# Patient Record
Sex: Male | Born: 1940 | Race: White | Hispanic: No | Smoking: Never smoker
Health system: Southern US, Community
[De-identification: ages and names within clinical notes are randomized; demographics above are authoritative.]

## PROBLEM LIST (undated history)

## (undated) DIAGNOSIS — E785 Hyperlipidemia, unspecified: Secondary | ICD-10-CM

## (undated) DIAGNOSIS — I4891 Unspecified atrial fibrillation: Secondary | ICD-10-CM

## (undated) DIAGNOSIS — R531 Weakness: Secondary | ICD-10-CM

## (undated) DIAGNOSIS — T4145XA Adverse effect of unspecified anesthetic, initial encounter: Secondary | ICD-10-CM

## (undated) DIAGNOSIS — R0602 Shortness of breath: Secondary | ICD-10-CM

## (undated) DIAGNOSIS — I482 Chronic atrial fibrillation, unspecified: Secondary | ICD-10-CM

## (undated) DIAGNOSIS — R112 Nausea with vomiting, unspecified: Secondary | ICD-10-CM

## (undated) DIAGNOSIS — Z7901 Long term (current) use of anticoagulants: Secondary | ICD-10-CM

## (undated) DIAGNOSIS — I10 Essential (primary) hypertension: Secondary | ICD-10-CM

## (undated) DIAGNOSIS — Z9889 Other specified postprocedural states: Secondary | ICD-10-CM

## (undated) DIAGNOSIS — C61 Malignant neoplasm of prostate: Secondary | ICD-10-CM

## (undated) DIAGNOSIS — T8859XA Other complications of anesthesia, initial encounter: Secondary | ICD-10-CM

## (undated) HISTORY — DX: Weakness: R53.1

## (undated) HISTORY — PX: EXTERNAL AUDITORY CANAL RECONSTRUCTION: SHX428

## (undated) HISTORY — DX: Chronic atrial fibrillation, unspecified: I48.20

## (undated) HISTORY — DX: Hyperlipidemia, unspecified: E78.5

## (undated) HISTORY — DX: Unspecified atrial fibrillation: I48.91

## (undated) HISTORY — PX: PROSTATECTOMY: SHX69

## (undated) HISTORY — DX: Long term (current) use of anticoagulants: Z79.01

## (undated) HISTORY — PX: TONSILLECTOMY AND ADENOIDECTOMY: SUR1326

## (undated) HISTORY — DX: Shortness of breath: R06.02

## (undated) HISTORY — DX: Malignant neoplasm of prostate: C61

## (undated) HISTORY — PX: HERNIA REPAIR: SHX51

## (undated) HISTORY — DX: Essential (primary) hypertension: I10

## (undated) MED FILL — Fluorouracil IV Soln 5 GM/100ML (50 MG/ML): INTRAVENOUS | Qty: 62 | Status: AC

---

## 2000-03-07 ENCOUNTER — Encounter: Admission: RE | Admit: 2000-03-07 | Discharge: 2000-06-05 | Payer: Self-pay | Admitting: Radiation Oncology

## 2000-06-06 ENCOUNTER — Encounter: Admission: RE | Admit: 2000-06-06 | Discharge: 2000-09-04 | Payer: Self-pay | Admitting: Radiation Oncology

## 2006-11-19 ENCOUNTER — Ambulatory Visit (HOSPITAL_COMMUNITY): Admission: RE | Admit: 2006-11-19 | Discharge: 2006-11-19 | Payer: Self-pay | Admitting: Urology

## 2010-09-18 NOTE — Op Note (Signed)
NAME:  Lee Strong, Lee Strong             ACCOUNT NO.:  [id number]   MEDICAL RECORD NO.:  [id number]          PATIENT TYPE:  AMB   LOCATION:  DAY                          FACILITY:  [facility]   PHYSICIAN:  Ronald L. Earlene Plater, M.D.  DATE OF BIRTH:  21-May-1940   DATE OF PROCEDURE:  11/19/2006  DATE OF DISCHARGE:                               OPERATIVE REPORT   DIAGNOSIS:  Right ureterolithiasis with hydroureteronephrosis.   OPERATIVE PROCEDURE:  1. Cystourethroscopy.  2. Right retrograde pyelogram.  3. Holmium laser lithotripsy.  4. Basket stone extraction.  5. Placement of right double-J stent.   SURGEON:  Gaynelle Arabian, MD   ANESTHESIA:  LMA.   ESTIMATED BLOOD LOSS:  Negligible.   TUBES:  A 26-cm 6-French contoured double pigtail stent.   COMPLICATIONS:  None.   INDICATION FOR PROCEDURE:  Mr. 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  He  presented with right flank pain, nausea and vomiting.  He subsequently  underwent a CT scan which revealed a 4 x 5-mm stone at the right  ureterovesical junction with significant hydroureteronephrosis.  He had  also had some hematuria.  He has been cleared for surgery by Dr. Sherlyn Lick,  Cardiology, and also has stopped his Coumadin after understanding risks,  benefits and alternatives and elected to proceed with the above  procedure.   PROCEDURE IN DETAIL:  The patient was placed in supine position and  after proper LMA anesthesia, he was placed in the dorsal lithotomy  position and prepped and draped with Betadine in sterile fashion.  Cystourethroscopy was performed with a 22.5-French Olympus panendoscope.  There was a tiny filamentous bulbar urethral stricture which was easily  dilated with the scope and the bladder was inspected and noted to be  without lesions.  The stone appeared to be impacted in the distal ureter  and with some manipulation, I was able to get a 0.038-French Glidewire  past it and dilated with an inner dilating sheath over the ureteral  access catheter.  Ureteroscopy was then performed with a short thin  ureteroscope and the stone was visualized; it was highly impacted in the  distal orifice and utilizing a 365 micron laser fiber on a setting of  0.5 and repetition rate of 5, the stone was fragmented into multiple  fragments and each of these were grasped with a nitinol basket and  extracted intact.  Inspection of the lower 2/3 of the ureter revealed  there were no perforations and there were no significant stones noted.  A 6-French open-ended catheter was placed into the right renal pelvis.  It was noted that the ureter was quite tortuous from hydronephrosis and  a Glidewire and steep Trendelenburg were necessary to negotiate into the  right renal pelvis.  There were no filling defects in the pelvis and for  left retrograde pyelogram, the sensor wire was placed into the right  renal pelvis and under fluoroscopic guidance, a 26-cm 6-French Contour  double-pigtail stent without a pullout string was placed and noted to be  in good position within  the right renal pelvis and within the bladder.  The bladder was drained, the panendoscope was removed and the patient  was taken to the recovery room stable.   Retrograde ureteral pyelogram:  Utilizing a 6-French open-ended  catheter, a retrograde ureteral pyelogram was performed.  He was noted  to have tortuous ureter, but there were no filling defects and the  collecting system.      Ronald L. Earlene Plater, M.D.  Electronically Signed     RLD/MEDQ  D:  11/19/2006  T:  11/20/2006  Job:  [id number]

## 2011-02-18 LAB — PROTIME-INR: Prothrombin Time: 14.4

## 2011-02-19 LAB — URINALYSIS, ROUTINE W REFLEX MICROSCOPIC
Glucose, UA: NEGATIVE
Hgb urine dipstick: NEGATIVE
Protein, ur: NEGATIVE
Specific Gravity, Urine: 1.019
pH: 6

## 2011-02-19 LAB — CBC
HCT: 40.6
Hemoglobin: 13.9
MCHC: 34.3
MCV: 90.8
RBC: 4.47
WBC: 6

## 2011-02-19 LAB — BASIC METABOLIC PANEL
CO2: 31
Chloride: 101
GFR calc Af Amer: >60
Potassium: 4.3
Sodium: 138

## 2014-05-09 DIAGNOSIS — Z7901 Long term (current) use of anticoagulants: Secondary | ICD-10-CM | POA: Diagnosis not present

## 2014-07-05 DIAGNOSIS — I482 Chronic atrial fibrillation: Secondary | ICD-10-CM | POA: Diagnosis not present

## 2014-08-02 DIAGNOSIS — I482 Chronic atrial fibrillation: Secondary | ICD-10-CM | POA: Diagnosis not present

## 2014-08-29 DIAGNOSIS — M150 Primary generalized (osteo)arthritis: Secondary | ICD-10-CM | POA: Diagnosis not present

## 2014-08-31 DIAGNOSIS — I482 Chronic atrial fibrillation: Secondary | ICD-10-CM | POA: Diagnosis not present

## 2014-09-14 DIAGNOSIS — I482 Chronic atrial fibrillation: Secondary | ICD-10-CM | POA: Diagnosis not present

## 2014-09-22 DIAGNOSIS — I482 Chronic atrial fibrillation: Secondary | ICD-10-CM | POA: Diagnosis not present

## 2014-09-22 DIAGNOSIS — Z1389 Encounter for screening for other disorder: Secondary | ICD-10-CM | POA: Diagnosis not present

## 2014-09-22 DIAGNOSIS — Z125 Encounter for screening for malignant neoplasm of prostate: Secondary | ICD-10-CM | POA: Diagnosis not present

## 2014-09-22 DIAGNOSIS — I119 Hypertensive heart disease without heart failure: Secondary | ICD-10-CM | POA: Diagnosis not present

## 2014-09-22 DIAGNOSIS — Z79899 Other long term (current) drug therapy: Secondary | ICD-10-CM | POA: Diagnosis not present

## 2014-09-22 DIAGNOSIS — Z9181 History of falling: Secondary | ICD-10-CM | POA: Diagnosis not present

## 2014-11-10 DIAGNOSIS — Z7901 Long term (current) use of anticoagulants: Secondary | ICD-10-CM | POA: Diagnosis not present

## 2014-12-20 DIAGNOSIS — I482 Chronic atrial fibrillation, unspecified: Secondary | ICD-10-CM | POA: Insufficient documentation

## 2014-12-20 DIAGNOSIS — I10 Essential (primary) hypertension: Secondary | ICD-10-CM

## 2014-12-20 DIAGNOSIS — Z7901 Long term (current) use of anticoagulants: Secondary | ICD-10-CM

## 2014-12-20 HISTORY — DX: Essential (primary) hypertension: I10

## 2014-12-20 HISTORY — DX: Chronic atrial fibrillation, unspecified: I48.20

## 2014-12-20 HISTORY — DX: Long term (current) use of anticoagulants: Z79.01

## 2014-12-21 DIAGNOSIS — Z7901 Long term (current) use of anticoagulants: Secondary | ICD-10-CM | POA: Diagnosis not present

## 2014-12-21 DIAGNOSIS — I482 Chronic atrial fibrillation: Secondary | ICD-10-CM | POA: Diagnosis not present

## 2014-12-21 DIAGNOSIS — I10 Essential (primary) hypertension: Secondary | ICD-10-CM | POA: Diagnosis not present

## 2014-12-23 DIAGNOSIS — Z7901 Long term (current) use of anticoagulants: Secondary | ICD-10-CM | POA: Diagnosis not present

## 2014-12-24 DIAGNOSIS — S60212A Contusion of left wrist, initial encounter: Secondary | ICD-10-CM | POA: Diagnosis not present

## 2014-12-30 DIAGNOSIS — Z7901 Long term (current) use of anticoagulants: Secondary | ICD-10-CM | POA: Diagnosis not present

## 2015-01-27 DIAGNOSIS — Z7901 Long term (current) use of anticoagulants: Secondary | ICD-10-CM | POA: Diagnosis not present

## 2015-01-30 DIAGNOSIS — Z7901 Long term (current) use of anticoagulants: Secondary | ICD-10-CM | POA: Diagnosis not present

## 2015-02-07 DIAGNOSIS — Z7901 Long term (current) use of anticoagulants: Secondary | ICD-10-CM | POA: Diagnosis not present

## 2015-03-15 DIAGNOSIS — Z7901 Long term (current) use of anticoagulants: Secondary | ICD-10-CM | POA: Diagnosis not present

## 2015-03-15 DIAGNOSIS — I482 Chronic atrial fibrillation: Secondary | ICD-10-CM | POA: Diagnosis not present

## 2015-03-29 DIAGNOSIS — I482 Chronic atrial fibrillation: Secondary | ICD-10-CM | POA: Diagnosis not present

## 2015-03-29 DIAGNOSIS — Z7901 Long term (current) use of anticoagulants: Secondary | ICD-10-CM | POA: Diagnosis not present

## 2015-05-31 DIAGNOSIS — I4891 Unspecified atrial fibrillation: Secondary | ICD-10-CM | POA: Diagnosis not present

## 2015-08-02 DIAGNOSIS — Z7901 Long term (current) use of anticoagulants: Secondary | ICD-10-CM | POA: Diagnosis not present

## 2015-09-27 DIAGNOSIS — Z7901 Long term (current) use of anticoagulants: Secondary | ICD-10-CM | POA: Diagnosis not present

## 2015-10-10 DIAGNOSIS — Z7901 Long term (current) use of anticoagulants: Secondary | ICD-10-CM | POA: Diagnosis not present

## 2015-10-25 DIAGNOSIS — Z7901 Long term (current) use of anticoagulants: Secondary | ICD-10-CM | POA: Diagnosis not present

## 2015-11-09 DIAGNOSIS — Z7901 Long term (current) use of anticoagulants: Secondary | ICD-10-CM | POA: Diagnosis not present

## 2015-11-23 DIAGNOSIS — Z7901 Long term (current) use of anticoagulants: Secondary | ICD-10-CM | POA: Diagnosis not present

## 2015-12-28 DIAGNOSIS — Z7901 Long term (current) use of anticoagulants: Secondary | ICD-10-CM | POA: Diagnosis not present

## 2016-01-11 DIAGNOSIS — Z7901 Long term (current) use of anticoagulants: Secondary | ICD-10-CM | POA: Diagnosis not present

## 2016-02-02 DIAGNOSIS — Z7901 Long term (current) use of anticoagulants: Secondary | ICD-10-CM | POA: Diagnosis not present

## 2016-02-02 DIAGNOSIS — I10 Essential (primary) hypertension: Secondary | ICD-10-CM | POA: Diagnosis not present

## 2016-02-02 DIAGNOSIS — I482 Chronic atrial fibrillation: Secondary | ICD-10-CM | POA: Diagnosis not present

## 2016-02-13 DIAGNOSIS — Z7901 Long term (current) use of anticoagulants: Secondary | ICD-10-CM | POA: Diagnosis not present

## 2016-02-20 DIAGNOSIS — Z2821 Immunization not carried out because of patient refusal: Secondary | ICD-10-CM | POA: Diagnosis not present

## 2016-02-20 DIAGNOSIS — E785 Hyperlipidemia, unspecified: Secondary | ICD-10-CM | POA: Diagnosis not present

## 2016-02-20 DIAGNOSIS — Z0000 Encounter for general adult medical examination without abnormal findings: Secondary | ICD-10-CM | POA: Diagnosis not present

## 2016-02-20 DIAGNOSIS — Z1389 Encounter for screening for other disorder: Secondary | ICD-10-CM | POA: Diagnosis not present

## 2016-02-20 DIAGNOSIS — Z79899 Other long term (current) drug therapy: Secondary | ICD-10-CM | POA: Diagnosis not present

## 2016-02-20 DIAGNOSIS — Z9181 History of falling: Secondary | ICD-10-CM | POA: Diagnosis not present

## 2016-03-19 DIAGNOSIS — Z7901 Long term (current) use of anticoagulants: Secondary | ICD-10-CM | POA: Diagnosis not present

## 2016-04-23 DIAGNOSIS — M1712 Unilateral primary osteoarthritis, left knee: Secondary | ICD-10-CM | POA: Diagnosis not present

## 2016-04-23 DIAGNOSIS — E785 Hyperlipidemia, unspecified: Secondary | ICD-10-CM | POA: Diagnosis not present

## 2016-04-23 DIAGNOSIS — I482 Chronic atrial fibrillation: Secondary | ICD-10-CM | POA: Diagnosis not present

## 2016-04-23 DIAGNOSIS — Z7901 Long term (current) use of anticoagulants: Secondary | ICD-10-CM | POA: Diagnosis not present

## 2016-04-23 DIAGNOSIS — I119 Hypertensive heart disease without heart failure: Secondary | ICD-10-CM | POA: Diagnosis not present

## 2016-05-30 DIAGNOSIS — Z7901 Long term (current) use of anticoagulants: Secondary | ICD-10-CM | POA: Diagnosis not present

## 2016-07-02 DIAGNOSIS — I119 Hypertensive heart disease without heart failure: Secondary | ICD-10-CM | POA: Diagnosis not present

## 2016-07-02 DIAGNOSIS — R55 Syncope and collapse: Secondary | ICD-10-CM | POA: Diagnosis not present

## 2016-07-02 DIAGNOSIS — I482 Chronic atrial fibrillation: Secondary | ICD-10-CM | POA: Diagnosis not present

## 2016-07-02 DIAGNOSIS — R42 Dizziness and giddiness: Secondary | ICD-10-CM | POA: Diagnosis not present

## 2016-07-02 DIAGNOSIS — S90511A Abrasion, right ankle, initial encounter: Secondary | ICD-10-CM | POA: Diagnosis not present

## 2016-07-24 DIAGNOSIS — Z7901 Long term (current) use of anticoagulants: Secondary | ICD-10-CM | POA: Diagnosis not present

## 2016-09-03 DIAGNOSIS — Z7901 Long term (current) use of anticoagulants: Secondary | ICD-10-CM | POA: Diagnosis not present

## 2016-10-30 DIAGNOSIS — Z7901 Long term (current) use of anticoagulants: Secondary | ICD-10-CM | POA: Diagnosis not present

## 2016-11-20 ENCOUNTER — Other Ambulatory Visit: Payer: Self-pay | Admitting: Cardiology

## 2016-12-17 ENCOUNTER — Ambulatory Visit (INDEPENDENT_AMBULATORY_CARE_PROVIDER_SITE_OTHER): Payer: Medicare Other | Admitting: Cardiology

## 2016-12-17 ENCOUNTER — Encounter: Payer: Self-pay | Admitting: Cardiology

## 2016-12-17 VITALS — BP 130/80 | HR 64 | Resp 10 | Ht 74.0 in | Wt 182.8 lb

## 2016-12-17 DIAGNOSIS — I482 Chronic atrial fibrillation, unspecified: Secondary | ICD-10-CM

## 2016-12-17 DIAGNOSIS — Z7901 Long term (current) use of anticoagulants: Secondary | ICD-10-CM

## 2016-12-17 DIAGNOSIS — I10 Essential (primary) hypertension: Secondary | ICD-10-CM

## 2016-12-17 DIAGNOSIS — R55 Syncope and collapse: Secondary | ICD-10-CM

## 2016-12-17 NOTE — Progress Notes (Signed)
Cardiology Office Note:    Date:  12/17/2016   ID:  Lee Strong, DOB February 23, 1941, MRN [id number]  PCP:  Cyndy Freeze, MD  Cardiologist:  Shirlee More, MD    Referring MD: Cyndy Freeze, MD    ASSESSMENT:    1. Chronic atrial fibrillation ([place])   2. Essential hypertension   3. Long term (current) use of anticoagulants   4. Syncope, unspecified syncope type    PLAN:    In order of problems listed above:  1. Rate is controlled continue beta blocker warfarin anticoagulation and check Holter monitor to assess for symptomatic bradycardia. 2. Stable blood pressure target at this time we'll stop calcium channel blocker and monitor home blood pressure. 3. Stable continue warfarin goal INR 2-3.5 4. Further evaluation with monitor    Next appointment: 6 months all   Medication Adjustments/Labs and Tests Ordered: Current medicines are reviewed at length with the patient today.  Concerns regarding medicines are outlined above.  Orders Placed This Encounter  Procedures  . Holter monitor - 48 hour  . EKG 12-Lead   No orders of the defined types were placed in this encounter.   Chief Complaint  Patient presents with  . Follow-up  . Atrial Fibrillation    History of Present Illness:    Lee Strong is a 76 y.o. male with a hx of chronic AF on warfarin, RBBB and hypertension last seen in September 2017. Overall he is done well little or no awareness of atrial fibrillation exercise intolerance chest pain or shortness of breath or TIA. He's had no bleeding complications of his anticoagulant. He notices dependent edema at the end of the day takes a calcium channel blocker and several months ago had an abrupt syncopal episode without medical evaluation. Compliance with diet, lifestyle and medications: Yes Past Medical History:  Diagnosis Date  . Chronic atrial fibrillation ([place]) 12/20/2014   Overview:  .CHADS2 vasc score=2  . Essential hypertension 12/20/2014  . Long  term (current) use of anticoagulants 12/20/2014    Past Surgical History:  Procedure Laterality Date  . HERNIA REPAIR    . PROSTATECTOMY      Current Medications: Current Meds  Medication Sig  . metoprolol tartrate (LOPRESSOR) 50 MG tablet Take 1 tablet by mouth 2 (two) times daily.  Marland Kitchen olmesartan (BENICAR) 20 MG tablet Take 1 tablet by mouth daily.  Marland Kitchen warfarin (COUMADIN) 5 MG tablet TAKE 1 TABLET EVERY DAY OR AS DIRECTED  . [DISCONTINUED] amLODipine (NORVASC) 5 MG tablet Take 1 tablet by mouth daily.     Allergies:   Penicillins   Social History   Social History  . Marital status: Married    Spouse name: N/A  . Number of children: N/A  . Years of education: N/A   Social History Main Topics  . Smoking status: Never Smoker  . Smokeless tobacco: Never Used  . Alcohol use No  . Drug use: No  . Sexual activity: Not Asked   Other Topics Concern  . None   Social History Narrative  . None     Family History: The patient's family history includes Heart attack in his father; Hypertension in his father and mother; Stroke in his mother. ROS:   Please see the history of present illness.    All other systems reviewed and are negative.  EKGs/Labs/Other Studies Reviewed:    The following studies were reviewed today:  EKG:  EKG ordered today.  The ekg ordered today demonstrates Rate controlled atrial fibrillation right bundle  branch block  Recent Labs: No results found for requested labs within last 8760 hours.  Recent Lipid Panel No results found for: CHOL, TRIG, HDL, CHOLHDL, VLDL, LDLCALC, LDLDIRECT  Physical Exam:    VS:  BP 130/80   Pulse 64   Resp 10   Ht 6\' 2"  (1.88 m)   Wt 182 lb 12.8 oz (82.9 kg)   BMI 23.47 kg/m     Wt Readings from Last 3 Encounters:  12/17/16 182 lb 12.8 oz (82.9 kg)     GEN:  Well nourished, well developed in no acute distress HEENT: Normal NECK: No JVD; No carotid bruits LYMPHATICS: No lymphadenopathy CARDIAC: RRR, no murmurs,  rubs, gallops RESPIRATORY:  Clear to auscultation without rales, wheezing or rhonchi  ABDOMEN: Soft, non-tender, non-distended MUSCULOSKELETAL:  No edema; No deformity  SKIN: Warm and dry NEUROLOGIC:  Alert and oriented x 3 PSYCHIATRIC:  Normal affect    Signed, Shirlee More, MD  12/17/2016 12:05 PM    [place]

## 2016-12-17 NOTE — Patient Instructions (Addendum)
Medication Instructions:  Your physician has recommended you make the following change in your medication:  STOP amlodipine Check your BP daily, contact me if systolic remains > 696   Labwork: None  Testing/Procedures: You had an EKG today.   Your physician has recommended that you wear a holter monitor. Holter monitors are medical devices that record the heart's electrical activity. Doctors most often use these monitors to diagnose arrhythmias. Arrhythmias are problems with the speed or rhythm of the heartbeat. The monitor is a small, portable device. You can wear one while you do your normal daily activities. This is usually used to diagnose what is causing palpitations/syncope (passing out).   Follow-Up: Your physician wants you to follow-up in: 6 months. You will receive a reminder letter in the mail two months in advance. If you don't receive a letter, please call our office to schedule the follow-up appointment.   Any Other Special Instructions Will Be Listed Below (If Applicable).     If you need a refill on your cardiac medications before your next appointment, please call your pharmacy.     Check your BP daily, contact me if systolic remains > 295

## 2016-12-23 ENCOUNTER — Other Ambulatory Visit: Payer: Self-pay | Admitting: Cardiology

## 2016-12-23 NOTE — Telephone Encounter (Signed)
Left message to return call regarding where patient is having is coumadin checked.

## 2016-12-24 NOTE — Telephone Encounter (Signed)
Left msg for pt to return call to notify our office where he plans to continue cardiac care, and where he is having his coumadin checked.

## 2016-12-31 DIAGNOSIS — Z7901 Long term (current) use of anticoagulants: Secondary | ICD-10-CM | POA: Diagnosis not present

## 2017-01-01 ENCOUNTER — Ambulatory Visit (INDEPENDENT_AMBULATORY_CARE_PROVIDER_SITE_OTHER): Payer: Medicare Other

## 2017-01-01 DIAGNOSIS — I482 Chronic atrial fibrillation, unspecified: Secondary | ICD-10-CM

## 2017-02-20 DIAGNOSIS — E785 Hyperlipidemia, unspecified: Secondary | ICD-10-CM | POA: Diagnosis not present

## 2017-02-20 DIAGNOSIS — Z125 Encounter for screening for malignant neoplasm of prostate: Secondary | ICD-10-CM | POA: Diagnosis not present

## 2017-02-20 DIAGNOSIS — I119 Hypertensive heart disease without heart failure: Secondary | ICD-10-CM | POA: Diagnosis not present

## 2017-02-20 DIAGNOSIS — Z7901 Long term (current) use of anticoagulants: Secondary | ICD-10-CM | POA: Diagnosis not present

## 2017-02-20 DIAGNOSIS — Z79899 Other long term (current) drug therapy: Secondary | ICD-10-CM | POA: Diagnosis not present

## 2017-02-20 DIAGNOSIS — I482 Chronic atrial fibrillation: Secondary | ICD-10-CM | POA: Diagnosis not present

## 2017-03-29 ENCOUNTER — Other Ambulatory Visit: Payer: Self-pay | Admitting: Cardiology

## 2017-04-22 DIAGNOSIS — I872 Venous insufficiency (chronic) (peripheral): Secondary | ICD-10-CM | POA: Diagnosis not present

## 2017-04-22 DIAGNOSIS — Z79899 Other long term (current) drug therapy: Secondary | ICD-10-CM | POA: Diagnosis not present

## 2017-04-22 DIAGNOSIS — R600 Localized edema: Secondary | ICD-10-CM | POA: Diagnosis not present

## 2017-04-22 DIAGNOSIS — I482 Chronic atrial fibrillation: Secondary | ICD-10-CM | POA: Diagnosis not present

## 2017-04-22 DIAGNOSIS — Z7901 Long term (current) use of anticoagulants: Secondary | ICD-10-CM | POA: Diagnosis not present

## 2017-04-22 DIAGNOSIS — I119 Hypertensive heart disease without heart failure: Secondary | ICD-10-CM | POA: Diagnosis not present

## 2017-04-25 DIAGNOSIS — R600 Localized edema: Secondary | ICD-10-CM | POA: Diagnosis not present

## 2017-04-25 DIAGNOSIS — M25571 Pain in right ankle and joints of right foot: Secondary | ICD-10-CM | POA: Diagnosis not present

## 2017-04-25 DIAGNOSIS — M7989 Other specified soft tissue disorders: Secondary | ICD-10-CM | POA: Diagnosis not present

## 2017-04-25 DIAGNOSIS — I872 Venous insufficiency (chronic) (peripheral): Secondary | ICD-10-CM | POA: Diagnosis not present

## 2017-04-28 DIAGNOSIS — R600 Localized edema: Secondary | ICD-10-CM | POA: Diagnosis not present

## 2017-04-28 DIAGNOSIS — I872 Venous insufficiency (chronic) (peripheral): Secondary | ICD-10-CM | POA: Diagnosis not present

## 2017-04-28 DIAGNOSIS — R2242 Localized swelling, mass and lump, left lower limb: Secondary | ICD-10-CM | POA: Diagnosis not present

## 2017-06-03 DIAGNOSIS — I482 Chronic atrial fibrillation: Secondary | ICD-10-CM | POA: Diagnosis not present

## 2017-06-03 DIAGNOSIS — M1712 Unilateral primary osteoarthritis, left knee: Secondary | ICD-10-CM | POA: Diagnosis not present

## 2017-06-03 DIAGNOSIS — Z7901 Long term (current) use of anticoagulants: Secondary | ICD-10-CM | POA: Diagnosis not present

## 2017-06-03 DIAGNOSIS — Z6824 Body mass index (BMI) 24.0-24.9, adult: Secondary | ICD-10-CM | POA: Diagnosis not present

## 2017-06-09 DIAGNOSIS — Z880 Allergy status to penicillin: Secondary | ICD-10-CM | POA: Diagnosis not present

## 2017-06-09 DIAGNOSIS — I480 Paroxysmal atrial fibrillation: Secondary | ICD-10-CM | POA: Diagnosis not present

## 2017-06-09 DIAGNOSIS — Z7902 Long term (current) use of antithrombotics/antiplatelets: Secondary | ICD-10-CM | POA: Diagnosis not present

## 2017-06-09 DIAGNOSIS — I5043 Acute on chronic combined systolic (congestive) and diastolic (congestive) heart failure: Secondary | ICD-10-CM | POA: Diagnosis not present

## 2017-06-09 DIAGNOSIS — I509 Heart failure, unspecified: Secondary | ICD-10-CM | POA: Diagnosis not present

## 2017-06-09 DIAGNOSIS — R0602 Shortness of breath: Secondary | ICD-10-CM | POA: Diagnosis not present

## 2017-06-09 DIAGNOSIS — I482 Chronic atrial fibrillation: Secondary | ICD-10-CM | POA: Diagnosis not present

## 2017-06-09 DIAGNOSIS — I110 Hypertensive heart disease with heart failure: Secondary | ICD-10-CM | POA: Diagnosis not present

## 2017-06-09 DIAGNOSIS — I4891 Unspecified atrial fibrillation: Secondary | ICD-10-CM | POA: Diagnosis not present

## 2017-06-09 DIAGNOSIS — E7800 Pure hypercholesterolemia, unspecified: Secondary | ICD-10-CM | POA: Diagnosis not present

## 2017-06-09 DIAGNOSIS — I10 Essential (primary) hypertension: Secondary | ICD-10-CM | POA: Diagnosis not present

## 2017-06-09 DIAGNOSIS — E785 Hyperlipidemia, unspecified: Secondary | ICD-10-CM | POA: Diagnosis not present

## 2017-06-10 DIAGNOSIS — E785 Hyperlipidemia, unspecified: Secondary | ICD-10-CM | POA: Diagnosis not present

## 2017-06-10 DIAGNOSIS — I10 Essential (primary) hypertension: Secondary | ICD-10-CM | POA: Diagnosis not present

## 2017-06-10 DIAGNOSIS — I509 Heart failure, unspecified: Secondary | ICD-10-CM | POA: Diagnosis not present

## 2017-06-10 DIAGNOSIS — I5043 Acute on chronic combined systolic (congestive) and diastolic (congestive) heart failure: Secondary | ICD-10-CM | POA: Diagnosis not present

## 2017-06-10 DIAGNOSIS — I4891 Unspecified atrial fibrillation: Secondary | ICD-10-CM | POA: Diagnosis not present

## 2017-06-11 DIAGNOSIS — I509 Heart failure, unspecified: Secondary | ICD-10-CM | POA: Diagnosis not present

## 2017-06-11 DIAGNOSIS — I10 Essential (primary) hypertension: Secondary | ICD-10-CM | POA: Diagnosis not present

## 2017-06-11 DIAGNOSIS — I5043 Acute on chronic combined systolic (congestive) and diastolic (congestive) heart failure: Secondary | ICD-10-CM | POA: Diagnosis not present

## 2017-06-11 DIAGNOSIS — I4891 Unspecified atrial fibrillation: Secondary | ICD-10-CM | POA: Diagnosis not present

## 2017-06-11 DIAGNOSIS — E785 Hyperlipidemia, unspecified: Secondary | ICD-10-CM | POA: Diagnosis not present

## 2017-06-14 DIAGNOSIS — I4891 Unspecified atrial fibrillation: Secondary | ICD-10-CM | POA: Diagnosis not present

## 2017-06-14 DIAGNOSIS — I5043 Acute on chronic combined systolic (congestive) and diastolic (congestive) heart failure: Secondary | ICD-10-CM | POA: Diagnosis not present

## 2017-06-14 DIAGNOSIS — I10 Essential (primary) hypertension: Secondary | ICD-10-CM | POA: Diagnosis not present

## 2017-06-16 DIAGNOSIS — Z7901 Long term (current) use of anticoagulants: Secondary | ICD-10-CM | POA: Diagnosis not present

## 2017-06-17 DIAGNOSIS — I5032 Chronic diastolic (congestive) heart failure: Secondary | ICD-10-CM | POA: Insufficient documentation

## 2017-06-17 DIAGNOSIS — E785 Hyperlipidemia, unspecified: Secondary | ICD-10-CM

## 2017-06-17 DIAGNOSIS — R531 Weakness: Secondary | ICD-10-CM

## 2017-06-17 DIAGNOSIS — I10 Essential (primary) hypertension: Secondary | ICD-10-CM

## 2017-06-17 DIAGNOSIS — I2510 Atherosclerotic heart disease of native coronary artery without angina pectoris: Secondary | ICD-10-CM | POA: Insufficient documentation

## 2017-06-17 DIAGNOSIS — T8859XA Other complications of anesthesia, initial encounter: Secondary | ICD-10-CM | POA: Insufficient documentation

## 2017-06-17 DIAGNOSIS — I4891 Unspecified atrial fibrillation: Secondary | ICD-10-CM

## 2017-06-17 DIAGNOSIS — R0602 Shortness of breath: Secondary | ICD-10-CM

## 2017-06-17 DIAGNOSIS — I519 Heart disease, unspecified: Secondary | ICD-10-CM | POA: Insufficient documentation

## 2017-06-17 DIAGNOSIS — I110 Hypertensive heart disease with heart failure: Secondary | ICD-10-CM | POA: Insufficient documentation

## 2017-06-17 DIAGNOSIS — Z7901 Long term (current) use of anticoagulants: Secondary | ICD-10-CM | POA: Insufficient documentation

## 2017-06-17 DIAGNOSIS — I482 Chronic atrial fibrillation, unspecified: Secondary | ICD-10-CM | POA: Insufficient documentation

## 2017-06-17 DIAGNOSIS — I2584 Coronary atherosclerosis due to calcified coronary lesion: Secondary | ICD-10-CM

## 2017-06-17 HISTORY — DX: Hyperlipidemia, unspecified: E78.5

## 2017-06-17 HISTORY — DX: Unspecified atrial fibrillation: I48.91

## 2017-06-17 HISTORY — DX: Essential (primary) hypertension: I10

## 2017-06-17 HISTORY — DX: Shortness of breath: R06.02

## 2017-06-17 HISTORY — DX: Weakness: R53.1

## 2017-06-17 NOTE — Progress Notes (Signed)
Cardiology Office Note:    Date:  06/18/2017   ID:  Lee Strong, DOB October 31, 1940, MRN [id number]  PCP:  Cyndy Freeze, MD  Cardiologist:  Shirlee More, MD    Referring MD: Cyndy Freeze, MD    ASSESSMENT:    1. Chronic diastolic heart failure ([place])   2. Hypertensive heart disease with heart failure ([place])   3. Right ventricular dysfunction   4. Chronic atrial fibrillation ([facility])   5. Chronic anticoagulation   6. Coronary artery calcification   7. On amiodarone therapy   8. High risk medication use    PLAN:    In order of problems listed above:  1. Heart failure is compensated continue his current loop diuretic check BMP BNP 2. Blood pressure uncontrolled continue treatment spironolactone and ARB check renal function and add hydralazine.  I did ask him to home monitor blood pressure 3. No evidence of right heart failure continue current diuretic 4. Rate controlled I do not think he needs to be on digoxin his GI symptoms may be subtle toxicity and I will discontinue 5. Continue warfarin goal INR of 2.5 6. At risk for obstructive CAD I do not have a mechanism to explain his deterioration is here chronic controlled atrial fibrillation on beta-blocker never had evidence of heart failure in the past.  I asked him to have a cardiac CTA done to screen for severe obstructive CAD that would require a change in treatment and revascularization. 7. Continue low-dose amiodarone rate control disc 8. Discontinue digoxin   Next appointment: 3 weeks   Medication Adjustments/Labs and Tests Ordered: Current medicines are reviewed at length with the patient today.  Concerns regarding medicines are outlined above.  Orders Placed This Encounter  Procedures  . CT CORONARY MORPH W/CTA COR W/SCORE W/CA W/CM &/OR WO/CM  . CT CORONARY FRACTIONAL FLOW RESERVE DATA PREP  . CT CORONARY FRACTIONAL FLOW RESERVE FLUID ANALYSIS  . Basic Metabolic Panel (BMET)  . Pro b natriuretic peptide (BNP)  .  EKG 12-Lead   No orders of the defined types were placed in this encounter.   Chief Complaint  Patient presents with  . Hospitalization Follow-up  . Congestive Heart Failure  . Atrial Fibrillation  . Hypertension    History of Present Illness:    Lee Strong is a 77 y.o. male with a hx of chronic atrial fibrillation and hypertension last seen approximately 1 year ago.  I am unable to access records in care everywhere.Marland Kitchen He recently presented to [facility] with decompensated heart failure and rapid atrial fibrillation requiring amiodarone, digoxin and a beta blocker for rate control and 6 KG diuresis. Compliance with diet, lifestyle and medications: Yes He relates the onset of edema lower extremities approximately 6 months ago since that time he had a slowly progressive pattern of exercise intolerance and fatigue edema exertional shortness of breath and subsequently presented to [facility] in acute decompensated heart failure.  Since discharge he feels weak and fatigued he has some GI upset he is on digoxin and amiodarone and he has no hoarseness.  He has had no angina but he does have coronary artery calcification.  He has long-standing atrial fibrillation at least 10 years in duration previously did not have trouble with rapid heart rates and never had congestive heart failure.  At the time of his deterioration his anticoagulation was withdrawn for joint steroid injection.  His weights are stable at home he does not monitor heart rate or blood pressure he does not  have home heart failure follow-up Past Medical History:  Diagnosis Date  . Atrial fibrillation ([place]) 06/17/2017  . Hyperlipidemia 06/17/2017  . Hypertension 06/17/2017  . Prostate cancer ([place])   . Short of breath on exertion 06/17/2017  . Weakness 06/17/2017    Past Surgical History:  Procedure Laterality Date  . PROSTATECTOMY    . TONSILLECTOMY AND ADENOIDECTOMY      Current Medications: Current Meds  Medication Sig    . amiodarone (PACERONE) 200 MG tablet Take 200 mg by mouth daily.  . digoxin (LANOXIN) 0.125 MG tablet Take 0.125 mg by mouth daily.  . furosemide (LASIX) 40 MG tablet Take 40 mg by mouth daily.  . Metoprolol Succinate 100 MG CS24 Take 2 tablets by mouth daily.  Marland Kitchen olmesartan (BENICAR) 20 MG tablet Take 20 mg by mouth daily.  Marland Kitchen spironolactone (ALDACTONE) 25 MG tablet Take 25 mg by mouth daily.  Marland Kitchen warfarin (COUMADIN) 5 MG tablet Take 2.5 mg by mouth daily.     Allergies:   Penicillins   Social History   Socioeconomic History  . Marital status: Single    Spouse name: None  . Number of children: None  . Years of education: None  . Highest education level: None  Social Needs  . Financial resource strain: None  . Food insecurity - worry: None  . Food insecurity - inability: None  . Transportation needs - medical: None  . Transportation needs - non-medical: None  Occupational History  . None  Tobacco Use  . Smoking status: Never Smoker  . Smokeless tobacco: Former Network engineer and Sexual Activity  . Alcohol use: No    Frequency: Never  . Drug use: No  . Sexual activity: None  Other Topics Concern  . None  Social History Narrative  . None     Family History: The patient's family history includes Colon cancer in his brother; Heart attack in his father; Prostate cancer in his brother; Stroke in his mother. ROS:   Please see the history of present illness.    All other systems reviewed and are negative.  EKGs/Labs/Other Studies Reviewed:    The following studies were reviewed today:  EKG:  EKG ordered today.  The ekg ordered today demonstrates atrial fibrillation controlled rate bifascicular heart block CTA: bilateral pleural effusion, R > L, CAC Echo TTE: EF 40-45%, mild to moderate MR, moderate TR, RV is dilated, paradoxical septal motion but RV systolic only 30 mm Hg Recent Labs: BNP 3780 No results found for requested labs within last 8760 hours.  Recent Lipid  Panel No results found for: CHOL, TRIG, HDL, CHOLHDL, VLDL, LDLCALC, LDLDIRECT  Physical Exam:    VS:  BP (!) 162/100 (BP Location: Right Arm, Patient Position: Sitting, Cuff Size: Normal)   Pulse 69   Ht 6' 2.5" (1.892 m)   Wt 163 lb (73.9 kg)   BMI 20.65 kg/m     Wt Readings from Last 3 Encounters:  06/18/17 163 lb (73.9 kg)     GEN: Appears somewhat frail and chronically ill  in no acute distress HEENT: Normal NECK: No JVD; No carotid bruits LYMPHATICS: No lymphadenopathy CARDIAC: Irregular irregular variable first heart sound no gallop no murmur  RESPIRATORY:  Clear to auscultation without rales, wheezing or rhonchi  ABDOMEN: Soft, non-tender, non-distended MUSCULOSKELETAL:  No edema; No deformity  SKIN: Warm and dry NEUROLOGIC:  Alert and oriented x 3 PSYCHIATRIC:  Normal affect    Signed, Shirlee More, MD  06/18/2017 5:10 PM  [facility]

## 2017-06-18 ENCOUNTER — Ambulatory Visit: Payer: Medicare Other | Admitting: Cardiology

## 2017-06-18 ENCOUNTER — Encounter: Payer: Self-pay | Admitting: Cardiology

## 2017-06-18 VITALS — BP 162/100 | HR 69 | Ht 74.5 in | Wt 163.0 lb

## 2017-06-18 DIAGNOSIS — I110 Hypertensive heart disease with heart failure: Secondary | ICD-10-CM

## 2017-06-18 DIAGNOSIS — I519 Heart disease, unspecified: Secondary | ICD-10-CM | POA: Diagnosis not present

## 2017-06-18 DIAGNOSIS — I482 Chronic atrial fibrillation, unspecified: Secondary | ICD-10-CM

## 2017-06-18 DIAGNOSIS — Z7901 Long term (current) use of anticoagulants: Secondary | ICD-10-CM | POA: Diagnosis not present

## 2017-06-18 DIAGNOSIS — I2510 Atherosclerotic heart disease of native coronary artery without angina pectoris: Secondary | ICD-10-CM

## 2017-06-18 DIAGNOSIS — I5032 Chronic diastolic (congestive) heart failure: Secondary | ICD-10-CM | POA: Diagnosis not present

## 2017-06-18 DIAGNOSIS — Z79899 Other long term (current) drug therapy: Secondary | ICD-10-CM | POA: Diagnosis not present

## 2017-06-18 DIAGNOSIS — I2584 Coronary atherosclerosis due to calcified coronary lesion: Secondary | ICD-10-CM

## 2017-06-18 MED ORDER — HYDRALAZINE HCL 25 MG PO TABS: 12.5000 mg | ORAL_TABLET | Freq: Three times a day (TID) | ORAL | 11 refills | Status: DC

## 2017-06-18 NOTE — Patient Instructions (Addendum)
Medication Instructions:  Your physician has recommended you make the following change in your medication:  STOP digoxin START hydralazine 12.5 mg three times daily  Labwork: Your physician recommends that you return for lab work in: today. BMP, BNP  Testing/Procedures: You had an EKG today.  Your physician has requested that you have cardiac CT. Cardiac computed tomography (CT) is a painless test that uses an x-ray machine to take clear, detailed pictures of your heart. For further information please visit [facility]tn. Please follow instruction sheet as given.  Please arrive at the [facility] main entrance of [facility] at xx:xx AM (30-45 minutes prior to test start time)  [facility] [address] [place], [place] [address] [phone]  Proceed to the [facility] Radiology Department (First Floor).  Please follow these instructions carefully (unless otherwise directed):  Hold all erectile dysfunction medications at least 48 hours prior to test.  On the Night Before the Test: . Drink plenty of water. . Do not consume any caffeinated/decaffeinated beverages or chocolate 12 hours prior to your test. . Do not take any antihistamines 12 hours prior to your test.  On the Day of the Test: . Drink plenty of water. Do not drink any water within one hour of the test. . Do not eat any food 4 hours prior to the test. . You may take your regular medications prior to the test. . HOLD Furosemide morning of the test.  After the Test: . Drink plenty of water. . After receiving IV contrast, you may experience a mild flushed feeling. This is normal. . On occasion, you may experience a mild rash up to 24 hours after the test. This is not dangerous. If this occurs, you can take Benadryl 25 mg and increase your fluid intake. . If you experience trouble breathing, this can be serious. If it is severe call 911 IMMEDIATELY. If it is mild, please call our  office.  Follow-Up: Your physician recommends that you schedule a follow-up appointment in: 3 weeks.  Any Other Special Instructions Will Be Listed Below (If Applicable).     If you need a refill on your cardiac medications before your next appointment, please call your pharmacy.

## 2017-06-19 LAB — BASIC METABOLIC PANEL
BUN/Creatinine Ratio: 27 — ABNORMAL HIGH (ref 10–24)
BUN: 34 mg/dL — AB (ref 8–27)
CALCIUM: 9.7 mg/dL (ref 8.6–10.2)
CHLORIDE: 98 mmol/L (ref 96–106)
CO2: 25 mmol/L (ref 20–29)
Creatinine, Ser: 1.28 mg/dL — ABNORMAL HIGH (ref 0.76–1.27)
GFR calc non Af Amer: 54 mL/min/{1.73_m2} — ABNORMAL LOW (ref >59)
GFR, EST AFRICAN AMERICAN: 62 mL/min/{1.73_m2} (ref >59)
GLUCOSE: 100 mg/dL — AB (ref 65–99)
Potassium: 4.5 mmol/L (ref 3.5–5.2)
Sodium: 139 mmol/L (ref 134–144)

## 2017-06-19 LAB — PRO B NATRIURETIC PEPTIDE: NT-PRO BNP: 1329 pg/mL — AB (ref 0–486)

## 2017-06-23 ENCOUNTER — Telehealth: Payer: Self-pay

## 2017-06-23 DIAGNOSIS — I482 Chronic atrial fibrillation, unspecified: Secondary | ICD-10-CM

## 2017-06-23 DIAGNOSIS — I5032 Chronic diastolic (congestive) heart failure: Secondary | ICD-10-CM

## 2017-06-23 DIAGNOSIS — I110 Hypertensive heart disease with heart failure: Secondary | ICD-10-CM

## 2017-06-23 NOTE — Telephone Encounter (Signed)
Per Dr Raliegh Ip pts wife was advised to check BMP to check pts kidney functions.  Once labs have been drawn and resulted, it will then be decided if pt should make any medication changes with diurectics. Pt will go to [facility] for bloodwork.  Pts wife verbalized understanding.

## 2017-06-23 NOTE — Telephone Encounter (Signed)
Pts wife calling with concerns regarding pts recent weight loss.  Pts wife states that since pts recent hospitalization he has lost 14 lbs total.  Since office visit on 2-13 his weight has dropped from 163 lbs to 159 lbs.  She is very concerned and would like to know if he should continue diuretics.  Will speak with Dr Raliegh Ip about this.

## 2017-06-24 ENCOUNTER — Encounter: Payer: Self-pay | Admitting: Cardiology

## 2017-06-24 DIAGNOSIS — I5032 Chronic diastolic (congestive) heart failure: Secondary | ICD-10-CM | POA: Diagnosis not present

## 2017-06-24 DIAGNOSIS — I482 Chronic atrial fibrillation: Secondary | ICD-10-CM | POA: Diagnosis not present

## 2017-06-24 DIAGNOSIS — I110 Hypertensive heart disease with heart failure: Secondary | ICD-10-CM | POA: Diagnosis not present

## 2017-06-25 LAB — BASIC METABOLIC PANEL
BUN / CREAT RATIO: 30 — AB (ref 10–24)
BUN: 48 mg/dL — AB (ref 8–27)
CHLORIDE: 101 mmol/L (ref 96–106)
CO2: 23 mmol/L (ref 20–29)
Calcium: 9.7 mg/dL (ref 8.6–10.2)
Creatinine, Ser: 1.61 mg/dL — ABNORMAL HIGH (ref 0.76–1.27)
GFR calc Af Amer: 47 mL/min/{1.73_m2} — ABNORMAL LOW (ref >59)
GFR calc non Af Amer: 41 mL/min/{1.73_m2} — ABNORMAL LOW (ref >59)
GLUCOSE: 111 mg/dL — AB (ref 65–99)
POTASSIUM: 5.2 mmol/L (ref 3.5–5.2)
SODIUM: 139 mmol/L (ref 134–144)

## 2017-06-26 ENCOUNTER — Telehealth: Payer: Self-pay

## 2017-06-26 DIAGNOSIS — I5032 Chronic diastolic (congestive) heart failure: Secondary | ICD-10-CM

## 2017-06-26 MED ORDER — SPIRONOLACTONE 25 MG PO TABS: 12.5000 mg | ORAL_TABLET | Freq: Every day | ORAL | 0 refills | Status: DC

## 2017-06-26 NOTE — Telephone Encounter (Signed)
-----   Message from Park Liter, MD sent at 06/25/2017  2:07 PM EST ----- Lower aldactone to 12.5 mg po qd  chem7 in 1 week

## 2017-06-26 NOTE — Telephone Encounter (Signed)
Patient advised of results. Patient advised to decrease aldactone to 12.5 mg daily. Patient verbalized understanding. Advised patient to have BMP rechecked in 1 week. Patient will go to [place] in [place]. Patient verbalized understanding, no further questions.

## 2017-07-04 DIAGNOSIS — I5032 Chronic diastolic (congestive) heart failure: Secondary | ICD-10-CM | POA: Diagnosis not present

## 2017-07-05 LAB — BASIC METABOLIC PANEL
BUN/Creatinine Ratio: 24 (ref 10–24)
BUN: 44 mg/dL — AB (ref 8–27)
CO2: 26 mmol/L (ref 20–29)
CREATININE: 1.81 mg/dL — AB (ref 0.76–1.27)
Calcium: 9.4 mg/dL (ref 8.6–10.2)
Chloride: 101 mmol/L (ref 96–106)
GFR, EST AFRICAN AMERICAN: 41 mL/min/{1.73_m2} — AB (ref >59)
GFR, EST NON AFRICAN AMERICAN: 35 mL/min/{1.73_m2} — AB (ref >59)
Glucose: 99 mg/dL (ref 65–99)
Potassium: 4.6 mmol/L (ref 3.5–5.2)
Sodium: 138 mmol/L (ref 134–144)

## 2017-07-09 ENCOUNTER — Ambulatory Visit: Payer: Medicare Other | Admitting: Cardiology

## 2017-07-10 ENCOUNTER — Other Ambulatory Visit: Payer: Self-pay | Admitting: Cardiology

## 2017-07-10 NOTE — Telephone Encounter (Signed)
PLEASE CALL PATIENT ABOUT REFILLS FROM HOSPITAL STAY

## 2017-07-11 MED ORDER — SPIRONOLACTONE 25 MG PO TABS: 12.5000 mg | ORAL_TABLET | Freq: Every day | ORAL | 3 refills | Status: DC

## 2017-07-11 MED ORDER — HYDRALAZINE HCL 25 MG PO TABS: 12.5000 mg | ORAL_TABLET | Freq: Three times a day (TID) | ORAL | 3 refills | Status: DC

## 2017-07-11 MED ORDER — AMIODARONE HCL 200 MG PO TABS: 200.0000 mg | ORAL_TABLET | Freq: Two times a day (BID) | ORAL | 3 refills | Status: DC

## 2017-07-11 MED ORDER — METOPROLOL SUCCINATE 100 MG PO CS24: 2.0000 | EXTENDED_RELEASE_CAPSULE | Freq: Every day | ORAL | 3 refills | Status: DC

## 2017-07-11 MED ORDER — FUROSEMIDE 40 MG PO TABS: 40.0000 mg | ORAL_TABLET | Freq: Every day | ORAL | 3 refills | Status: DC

## 2017-07-11 NOTE — Telephone Encounter (Signed)
Patient needed refills on the following medications: spironolactone, metoprolol, amiodarone, furosemide, hydralazine. Patient notified. Refills sent to [facility].

## 2017-07-11 NOTE — Telephone Encounter (Signed)
Left message to return call 

## 2017-07-14 ENCOUNTER — Ambulatory Visit (HOSPITAL_COMMUNITY): Payer: Medicare Other

## 2017-07-14 ENCOUNTER — Telehealth: Payer: Self-pay | Admitting: Cardiology

## 2017-07-14 DIAGNOSIS — I2584 Coronary atherosclerosis due to calcified coronary lesion: Principal | ICD-10-CM

## 2017-07-14 DIAGNOSIS — I2510 Atherosclerotic heart disease of native coronary artery without angina pectoris: Secondary | ICD-10-CM

## 2017-07-14 NOTE — Telephone Encounter (Signed)
Wants to know why CT was cancelled for today

## 2017-07-14 NOTE — Addendum Note (Signed)
Addended by: Warner Mccreedy E on: 07/14/2017 11:12 AM   Modules accepted: Orders

## 2017-07-14 NOTE — Telephone Encounter (Signed)
Advised patient of stress test scheduled for 07/22/17 at 7:45 am. Reviewed instructions with patient. Mailing letter to home address of instructions. Patient verbalized understanding. No further questions.

## 2017-07-14 NOTE — Telephone Encounter (Signed)
Advised patient cardiac CT was cancelled because patient is in afib. Advised patient that Dr. Bettina Strong would like to order a stress test instead. Patient verbalized understanding. Advised patient would contact him with appointment date and time once scheduled. Patient verbalized understanding, no further questions.

## 2017-07-16 ENCOUNTER — Other Ambulatory Visit (HOSPITAL_COMMUNITY): Payer: Medicare Other

## 2017-07-16 ENCOUNTER — Ambulatory Visit: Payer: Medicare Other | Admitting: Cardiology

## 2017-07-16 ENCOUNTER — Telehealth (HOSPITAL_COMMUNITY): Payer: Self-pay | Admitting: *Deleted

## 2017-07-16 NOTE — Telephone Encounter (Signed)
Left message on voicemail per DPR in reference to upcoming appointment scheduled on 07/21/17 with detailed instructions given per Myocardial Perfusion Study Information Sheet for the test. LM to arrive 15 minutes early, and that it is imperative to arrive on time for appointment to keep from having the test rescheduled. If you need to cancel or reschedule your appointment, please call the office within 24 hours of your appointment. Failure to do so may result in a cancellation of your appointment, and a $50 no show fee. Phone number given for call back for any questions. Kirstie Peri

## 2017-07-22 ENCOUNTER — Ambulatory Visit (HOSPITAL_COMMUNITY): Payer: Medicare Other | Attending: Cardiovascular Disease

## 2017-07-22 DIAGNOSIS — I2584 Coronary atherosclerosis due to calcified coronary lesion: Secondary | ICD-10-CM | POA: Insufficient documentation

## 2017-07-22 DIAGNOSIS — I2510 Atherosclerotic heart disease of native coronary artery without angina pectoris: Secondary | ICD-10-CM | POA: Diagnosis not present

## 2017-07-22 DIAGNOSIS — R9439 Abnormal result of other cardiovascular function study: Secondary | ICD-10-CM | POA: Diagnosis not present

## 2017-07-22 LAB — MYOCARDIAL PERFUSION IMAGING
CHL CUP NUCLEAR SSS: 9
CSEPPHR: 91 {beats}/min
LV sys vol: 40 mL
LVDIAVOL: 102 mL (ref 62–150)
NUC STRESS TID: 0.92
RATE: 0.3
Rest HR: 69 {beats}/min
SDS: 3
SRS: 6

## 2017-07-22 IMAGING — NM NM MISC PROCEDURE
5 series · 30 of 30 positions shown · non-contrast
Comparison: none

[Series 1: rest · 6.51mm/px · 6 of 64 frames shown]
[frame 6/64]
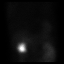
[frame 16/64]
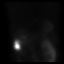
[frame 27/64]
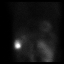
[frame 38/64]
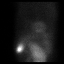
[frame 48/64]
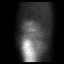
[frame 59/64]
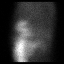

[Series 1: wbr_r-proj_st rest · 6.51mm/px · 6 of 64 frames shown]
[frame 6/64]
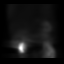
[frame 16/64]
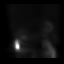
[frame 27/64]
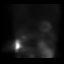
[frame 38/64]
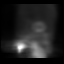
[frame 48/64]
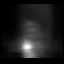
[frame 59/64]
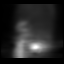

[Series 2: stress - gated · 6.51mm/px · 6 of 512 frames shown]
[frame 43/512]
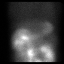
[frame 128/512]
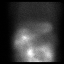
[frame 214/512]
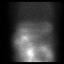
[frame 299/512]
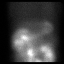
[frame 384/512]
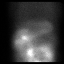
[frame 470/512]
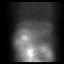

[Series 2: wbr_s-proj_st stress - gated · 6.51mm/px · 6 of 512 frames shown]
[frame 43/512]
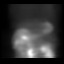
[frame 128/512]
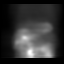
[frame 214/512]
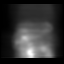
[frame 299/512]
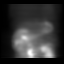
[frame 384/512]
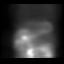
[frame 470/512]
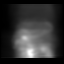

[Series 3: stress - perfusion · 6.51mm/px · 6 of 64 frames shown]
[frame 6/64]
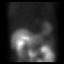
[frame 16/64]
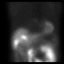
[frame 27/64]
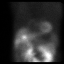
[frame 38/64]
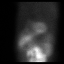
[frame 48/64]
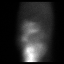
[frame 59/64]
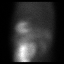

[30 of 30 positions shown; findings below may reference images not displayed]

Canned report from images found in remote index.

Refer to host system for actual result text.

## 2017-07-22 MED ORDER — TECHNETIUM TC 99M TETROFOSMIN IV KIT
10.8000 | PACK | Freq: Once | INTRAVENOUS | Status: AC | PRN
  Administered 2017-07-22: 10.8 via INTRAVENOUS
  Filled 2017-07-22: qty 11

## 2017-07-22 MED ORDER — REGADENOSON 0.4 MG/5ML IV SOLN
0.4000 mg | Freq: Once | INTRAVENOUS | Status: AC
  Administered 2017-07-22: 0.4 mg via INTRAVENOUS

## 2017-07-22 MED ORDER — TECHNETIUM TC 99M TETROFOSMIN IV KIT
31.6000 | PACK | Freq: Once | INTRAVENOUS | Status: AC | PRN
  Administered 2017-07-22: 31.6 via INTRAVENOUS
  Filled 2017-07-22: qty 32

## 2017-07-23 NOTE — Progress Notes (Signed)
Cardiology Office Note:    Date:  07/25/2017   ID:  Lee Strong, DOB [birth year], MRN [id number]  PCP:  Cyndy Freeze, MD  Cardiologist:  Shirlee More, MD    Referring MD: Cyndy Freeze, MD    ASSESSMENT:    1. Chronic atrial fibrillation ([place])   2. Hypertensive heart disease with heart failure ([place])   3. Chronic diastolic heart failure ([facility])    PLAN:    In order of problems listed above:  1. Stable home heart rates run in the 60s-70s I will have him discontinue amiodarone that was initiated as an inpatient for heart rate control in the setting of decompensated heart failure.  He will continue beta-blocker as well as anticoagulant and consider transition to direct oral anticoagulant he request an INR to be drawn in my office today his warfarin is managed by his PCP and I told him not to go more than 4 weeks between INR determinations 2. Stable blood pressure target continue current treatment including distal diuretic and ACE inhibitor and heart failure is compensated on his current loop diuretic 3. Stable compensated diastolic heart failure.  Continue current treatment.   Next appointment: 60-month   Medication Adjustments/Labs and Tests Ordered: Current medicines are reviewed at length with the patient today.  Concerns regarding medicines are outlined above.  Orders Placed This Encounter  Procedures  . Basic metabolic panel  . B Nat Peptide  . Protime-INR   Meds ordered this encounter  Medications  . hydrALAZINE (APRESOLINE) 25 MG tablet    Sig: Take 0.5 tablets (12.5 mg total) by mouth 2 (two) times daily.    Dispense:  135 tablet    Refill:  3  . spironolactone (ALDACTONE) 25 MG tablet    Sig: Take 0.5 tablets (12.5 mg total) by mouth daily.    Dispense:  45 tablet    Refill:  3    Chief Complaint  Patient presents with  . Follow-up  . Congestive Heart Failure  . Atrial Fibrillation  . Hypertension    History of Present Illness:    Lee Strong is a 77 y.o. male with a hx of  chronic atrial fibrillation and hypertension last seen approximately 1 year ago.  I am unable to access records in care everywhere.Marland KitchenHe recently presented to [facility] with decompensated heart failure and rapid atrial fibrillation requiring amiodarone, digoxin and a beta blocker for rate control and 6 KG diuresis. He was last seen 06/18/17. Compliance with diet, lifestyle and medications: Yes He is improved weight is stable no edema sodium restricts and feels he is back to normal.  He has had no edema chest pain shortness of breath palpitation or syncope.  We discussed the potential of switching to direct anticoagulant he will consider.  Myocardial perfusion study showed normal ejection fraction and no ischemia. Past Medical History:  Diagnosis Date  . Atrial fibrillation ([place]) 06/17/2017  . Chronic atrial fibrillation ([place]) 12/20/2014   Overview:  .CHADS2 vasc score=2  . Essential hypertension 12/20/2014  . Hyperlipidemia 06/17/2017  . Hypertension 06/17/2017  . Long term (current) use of anticoagulants 12/20/2014  . Prostate cancer ([facility])   . Short of breath on exertion 06/17/2017  . Weakness 06/17/2017    Past Surgical History:  Procedure Laterality Date  . HERNIA REPAIR    . PROSTATECTOMY    . TONSILLECTOMY AND ADENOIDECTOMY      Current Medications: Current Meds  Medication Sig  . furosemide (LASIX) 40 MG tablet Take 1 tablet (  40 mg total) by mouth daily.  . hydrALAZINE (APRESOLINE) 25 MG tablet Take 0.5 tablets (12.5 mg total) by mouth 2 (two) times daily.  . Metoprolol Succinate 100 MG CS24 Take 2 tablets by mouth daily.  Marland Kitchen olmesartan (BENICAR) 20 MG tablet Take 20 mg by mouth daily.  Marland Kitchen olmesartan (BENICAR) 20 MG tablet Take 1 tablet by mouth daily.  Marland Kitchen spironolactone (ALDACTONE) 25 MG tablet Take 0.5 tablets (12.5 mg total) by mouth daily.  Marland Kitchen warfarin (COUMADIN) 5 MG tablet Take 2.5 mg by mouth daily.  . [DISCONTINUED] amiodarone (PACERONE) 200 MG  tablet Take 1 tablet (200 mg total) by mouth 2 (two) times daily.  . [DISCONTINUED] hydrALAZINE (APRESOLINE) 25 MG tablet Take 0.5 tablets (12.5 mg total) by mouth 3 (three) times daily.  . [DISCONTINUED] spironolactone (ALDACTONE) 25 MG tablet Take 0.5 tablets (12.5 mg total) by mouth daily.  . [DISCONTINUED] warfarin (COUMADIN) 5 MG tablet TAKE 1 TABLET EVERY DAY OR AS DIRECTED     Allergies:   Penicillins and Penicillins   Social History   Socioeconomic History  . Marital status: Single    Spouse name: Not on file  . Number of children: Not on file  . Years of education: Not on file  . Highest education level: Not on file  Occupational History  . Not on file  Social Needs  . Financial resource strain: Not on file  . Food insecurity:    Worry: Not on file    Inability: Not on file  . Transportation needs:    Medical: Not on file    Non-medical: Not on file  Tobacco Use  . Smoking status: Never Smoker  . Smokeless tobacco: Former Network engineer and Sexual Activity  . Alcohol use: No    Frequency: Never  . Drug use: No  . Sexual activity: Not on file  Lifestyle  . Physical activity:    Days per week: Not on file    Minutes per session: Not on file  . Stress: Not on file  Relationships  . Social connections:    Talks on phone: Not on file    Gets together: Not on file    Attends religious service: Not on file    Active member of club or organization: Not on file    Attends meetings of clubs or organizations: Not on file    Relationship status: Not on file  Other Topics Concern  . Not on file  Social History Narrative   ** Merged History Encounter **         Family History: The patient's family history includes Colon cancer in his brother; Heart attack in his father; Hypertension in his father and mother; Prostate cancer in his brother; Stroke in his mother. ROS:   Please see the history of present illness.    All other systems reviewed and are  negative.  EKGs/Labs/Other Studies Reviewed:    The following studies were reviewed today:  KVQ:[id number] Highlights   Nuclear stress EF: 61%. The left ventricular ejection fraction is normal (55-65%).  Defect 1: There is a small defect of mild severity present in the apex location. This is most consistent with apical thinning  The study is normal. no ischemia. no infarction  This is a low risk study.     Recent Labs: 06/18/2017: NT-Pro BNP 1,329 07/24/2017: BNP WILL FOLLOW; BUN 27; Creatinine, Ser 1.31; Potassium 4.7; Sodium 140  Recent Lipid Panel No results found for: CHOL, TRIG, HDL, CHOLHDL, VLDL, LDLCALC,  LDLDIRECT  Physical Exam:    VS:  BP (!) 140/94 (BP Location: Right Arm, Patient Position: Sitting, Cuff Size: Normal)   Pulse 82   Ht 6\' 2"  (1.88 m)   Wt 167 lb (75.8 kg)   SpO2 99%   BMI 21.44 kg/m     Wt Readings from Last 3 Encounters:  07/24/17 167 lb (75.8 kg)  07/22/17 163 lb (73.9 kg)  06/18/17 163 lb (73.9 kg)     GEN:  Well nourished, well developed in no acute distress HEENT: Normal NECK: No JVD; No carotid bruits LYMPHATICS: No lymphadenopathy CARDIAC: Irregular irregular variable first heart sound no S3  RESPIRATORY:  Clear to auscultation without rales, wheezing or rhonchi  ABDOMEN: Soft, non-tender, non-distended MUSCULOSKELETAL:  No edema; No deformity  SKIN: Warm and dry NEUROLOGIC:  Alert and oriented x 3 PSYCHIATRIC:  Normal affect    Signed, Shirlee More, MD  07/25/2017 7:44 AM    [facility]

## 2017-07-24 ENCOUNTER — Ambulatory Visit (INDEPENDENT_AMBULATORY_CARE_PROVIDER_SITE_OTHER): Payer: Medicare Other | Admitting: Cardiology

## 2017-07-24 ENCOUNTER — Encounter: Payer: Self-pay | Admitting: Cardiology

## 2017-07-24 VITALS — BP 140/94 | HR 82 | Ht 74.0 in | Wt 167.0 lb

## 2017-07-24 DIAGNOSIS — I482 Chronic atrial fibrillation, unspecified: Secondary | ICD-10-CM

## 2017-07-24 DIAGNOSIS — I110 Hypertensive heart disease with heart failure: Secondary | ICD-10-CM | POA: Diagnosis not present

## 2017-07-24 DIAGNOSIS — I5032 Chronic diastolic (congestive) heart failure: Secondary | ICD-10-CM | POA: Diagnosis not present

## 2017-07-24 MED ORDER — HYDRALAZINE HCL 25 MG PO TABS: 12.5000 mg | ORAL_TABLET | Freq: Two times a day (BID) | ORAL | 3 refills | Status: DC

## 2017-07-24 MED ORDER — SPIRONOLACTONE 25 MG PO TABS: 12.5000 mg | ORAL_TABLET | Freq: Every day | ORAL | 3 refills | Status: DC

## 2017-07-24 NOTE — Patient Instructions (Addendum)
Medication Instructions:  Your physician has recommended you make the following change in your medication:   STOP: Amiodarone DECREASE: hydralazine to 12.5mg  twice daily    Labwork: Your physician recommends that you have lab work today: BMET, BNP, PROTIME   Testing/Procedures: NONE   Follow-Up: Your physician wants you to follow-up in: 4 months. You will receive a reminder letter in the mail two months in advance. If you don't receive a letter, please call our office to schedule the follow-up appointment.   Any Other Special Instructions Will Be Listed Below (If Applicable).     If you need a refill on your cardiac medications before your next appointment, please call your pharmacy.    Heart Failure  Weigh yourself every morning when you first wake up and record on a calender or note pad, bring this to your office visits. Using a pill tender can help with taking your medications consistently.  Limit your fluid intake to 2 liters daily  Limit your sodium intake to less than 2-3 grams daily. Ask if you need dietary teaching.  If you gain more than 3 pounds (from your dry weight ), double your dose of diuretic for the day.  If you gain more than 5 pounds (from your dry weight), double your dose of lasix and call your heart failure doctor.  Please do not smoke tobacco since it is very bad for your heart.  Please do not drink alcohol since it can worsen your heart failure.Also avoid OTC nonsteroidal drugs, such as advil, aleve and motrin.  Try to exercise for at least 30 minutes every day because this will help your heart be more efficient. You may be eligible for supervised cardiac rehab, ask your physician.

## 2017-07-25 LAB — BASIC METABOLIC PANEL
BUN/Creatinine Ratio: 21 (ref 10–24)
BUN: 27 mg/dL (ref 8–27)
CALCIUM: 9.1 mg/dL (ref 8.6–10.2)
CHLORIDE: 100 mmol/L (ref 96–106)
CO2: 27 mmol/L (ref 20–29)
Creatinine, Ser: 1.31 mg/dL — ABNORMAL HIGH (ref 0.76–1.27)
GFR calc non Af Amer: 52 mL/min/{1.73_m2} — ABNORMAL LOW (ref >59)
GFR, EST AFRICAN AMERICAN: 60 mL/min/{1.73_m2} (ref >59)
Glucose: 93 mg/dL (ref 65–99)
POTASSIUM: 4.7 mmol/L (ref 3.5–5.2)
SODIUM: 140 mmol/L (ref 134–144)

## 2017-07-25 LAB — PROTIME-INR
INR: 3.5 — ABNORMAL HIGH (ref 0.8–1.2)
PROTHROMBIN TIME: 33.7 s — AB (ref 9.1–12.0)

## 2017-07-25 LAB — BRAIN NATRIURETIC PEPTIDE: BNP: 345 pg/mL — ABNORMAL HIGH (ref 0.0–100.0)

## 2017-08-11 ENCOUNTER — Telehealth: Payer: Self-pay | Admitting: Cardiology

## 2017-08-11 NOTE — Telephone Encounter (Signed)
Advised patient he should not take Allegra. Advised he could take Claritin or Zyrtec for allergies.  Blood pressure in the mornings are 130/83-84, heart rate 66-70.Evening 100 or less on top and bottom 66, heart rate is about the same. Patient does not feel different and has not noticed any symptoms.  Discussed with Dr. Bettina Gavia. Advised not to make any changes at this time. Advised to continue to monitor heart rate and blood pressure and return call if worsens or any symptoms develop. Patient verbalized understanding. No further questions.

## 2017-08-11 NOTE — Telephone Encounter (Signed)
Please return call about taking Allegra, also blood pressure is real low in evening since start of new medicine.

## 2017-08-29 ENCOUNTER — Telehealth: Payer: Self-pay | Admitting: Cardiology

## 2017-08-29 NOTE — Telephone Encounter (Signed)
Left message for patient to return call.

## 2017-08-29 NOTE — Telephone Encounter (Signed)
Patient called and states that his BP is really dropping since he has been taking the new BP med, he documented that it drops from 90/60 and 90/58 at times and especially after taking meds..Please call patient and if he is not there you may leave a message on his Voicemail.

## 2017-08-29 NOTE — Telephone Encounter (Signed)
Patient states he is taking the following medications: spirnolactone 12.5 mg daily, hydralazine 12.5 mg daily, furosemide 40 mg daily, metoprolol succinate 100 mg twice daily, olmesartan 20 mg daily. His blood pressure has been 88-90/58-60 and his heart rate has been 58-85. Patient reports feeling lightheaded at times. Patient states he weighs himself every day. Dr. Agustin Cree advised for patient to decrease furosemide 40 mg to 20 mg daily unless he gains 2 or more pounds in one day. If he notices this weight gain, instructed patient to take 40 mg of furosemide. Encouraged patient to continue monitoring his blood pressure and to let us know if his symptoms do not improve. Patient verbalized understanding. No further questions.

## 2017-08-29 NOTE — Telephone Encounter (Signed)
Left message to return call on cell phone.

## 2017-09-19 ENCOUNTER — Telehealth: Payer: Self-pay | Admitting: Cardiology

## 2017-09-19 NOTE — Telephone Encounter (Signed)
FYI.  See previous telephone note. This was advised by Dr. Agustin Cree.

## 2017-09-19 NOTE — Telephone Encounter (Signed)
Per home health care nurse, he increased his furosemide back to one tablet instead of 1/2 tablet. Patient stated he did not feel 1/2 was working well enough.

## 2017-12-09 ENCOUNTER — Ambulatory Visit: Payer: Medicare Other | Admitting: Cardiology

## 2018-01-09 ENCOUNTER — Ambulatory Visit (INDEPENDENT_AMBULATORY_CARE_PROVIDER_SITE_OTHER): Payer: Medicare Other | Admitting: Cardiology

## 2018-01-09 ENCOUNTER — Encounter: Payer: Self-pay | Admitting: Cardiology

## 2018-01-09 VITALS — BP 118/84 | HR 81 | Ht 74.0 in | Wt 171.4 lb

## 2018-01-09 DIAGNOSIS — I482 Chronic atrial fibrillation, unspecified: Secondary | ICD-10-CM

## 2018-01-09 DIAGNOSIS — Z7901 Long term (current) use of anticoagulants: Secondary | ICD-10-CM | POA: Diagnosis not present

## 2018-01-09 DIAGNOSIS — I110 Hypertensive heart disease with heart failure: Secondary | ICD-10-CM

## 2018-01-09 DIAGNOSIS — I5032 Chronic diastolic (congestive) heart failure: Secondary | ICD-10-CM

## 2018-01-09 NOTE — Progress Notes (Signed)
Cardiology Office Note:    Date:  01/09/2018   ID:  Lee Strong, DOB 19-Jan-1941, MRN [id number]  PCP:  Cyndy Freeze, MD  Cardiologist:  Shirlee More, MD    Referring MD: Cyndy Freeze, MD    ASSESSMENT:    1. Chronic atrial fibrillation ([place])   2. Chronic diastolic heart failure ([place])   3. Long term (current) use of anticoagulants   4. Hypertensive heart disease with heart failure ([place])    PLAN:    In order of problems listed above:  1. Stable rate is controlled he is off amiodarone continue his beta-blocker with good rate control his current anticoagulant is strongly encouraged him to consider taking 1 of the new direct anticoagulants especially apixaban 2. Stable compensated continue his current loop diuretic recheck renal function potassium proBNP today 3. Worsened INR subtherapeutic likely from withdrawal of amiodarone which is a strong potentiation of anticoagulation with warfarin he will increase his dose and follow-up in 2 weeks with his PCP and hopefully will transition toward the new anticoagulants 4. Worsen his blood pressures are too low he will withdraw hydralazine and I suspect he will not require any additional antihypertensive therapy beyond his loop diuretic and beta-blocker   Next appointment: 6 months   Medication Adjustments/Labs and Tests Ordered: Current medicines are reviewed at length with the patient today.  Concerns regarding medicines are outlined above.  Orders Placed This Encounter  Procedures  . Basic Metabolic Panel (BMET)  . Pro b natriuretic peptide   No orders of the defined types were placed in this encounter.   Chief Complaint  Patient presents with  . Atrial Fibrillation  . Hypertension    History of Present Illness:    Lee Strong is a 77 y.o. male with a hx of  chronic atrial fibrillation and hypertension last seen 07/24/17. At that time amiodarone started for rate control was discontinued. Compliance with diet,  lifestyle and medications: Yes  From a cardiology perspective he is doing well no palpitation exercise intolerance shortness of breath chest pain syncope or TIA.  He has 2 concerns the first of which is sounds like a traumatic knee injury and is going to seek orthopedic surgical evaluation.  The second is that his INR has been subtherapeutic I told him I think this is because of the withdrawal of amiodarone I asked him to increase his warfarin 3 to 4 mg/day and follow-up INR with his PCP that manages warfarin goal INR 2.5 in about 2 weeks.  I strongly encouraged him to consider taking one the new direct anti-coagulants and he will explore the cost issue with his pharmacy benefit manager.  His home blood pressures have been running in the range of 100 -110 he has been taking a half dose of hydralazine once a day he will stop it and monitor home blood pressures his weights are stable he has no edema we will continue his current loop diuretic Past Medical History:  Diagnosis Date  . Atrial fibrillation ([place]) 06/17/2017  . Chronic atrial fibrillation ([place]) 12/20/2014   Overview:  .CHADS2 vasc score=2  . Essential hypertension 12/20/2014  . Hyperlipidemia 06/17/2017  . Hypertension 06/17/2017  . Long term (current) use of anticoagulants 12/20/2014  . Prostate cancer ([place])   . Short of breath on exertion 06/17/2017  . Weakness 06/17/2017    Past Surgical History:  Procedure Laterality Date  . HERNIA REPAIR    . PROSTATECTOMY    . TONSILLECTOMY AND ADENOIDECTOMY  Current Medications: Current Meds  Medication Sig  . furosemide (LASIX) 40 MG tablet Take 1 tablet (40 mg total) by mouth daily.  . Metoprolol Succinate 100 MG CS24 Take 2 tablets by mouth daily.  Marland Kitchen olmesartan (BENICAR) 20 MG tablet Take 1 tablet by mouth daily.  Marland Kitchen spironolactone (ALDACTONE) 25 MG tablet Take 0.5 tablets (12.5 mg total) by mouth daily.  . vitamin C (ASCORBIC ACID) 500 MG tablet Take 500 mg by mouth daily.  . Vitamin D,  Ergocalciferol, (DRISDOL) 50000 units CAPS capsule Take 1 capsule by mouth once a week.  Marland Kitchen VITAMIN E PO Take 1 tablet by mouth daily.  Marland Kitchen warfarin (COUMADIN) 3 MG tablet Take 3 mg by mouth daily.   . [DISCONTINUED] hydrALAZINE (APRESOLINE) 25 MG tablet Take 0.5 tablets (12.5 mg total) by mouth 2 (two) times daily. (Patient taking differently: Take 12.5 mg by mouth daily. )     Allergies:   Penicillins   Social History   Socioeconomic History  . Marital status: Single    Spouse name: Not on file  . Number of children: Not on file  . Years of education: Not on file  . Highest education level: Not on file  Occupational History  . Not on file  Social Needs  . Financial resource strain: Not on file  . Food insecurity:    Worry: Not on file    Inability: Not on file  . Transportation needs:    Medical: Not on file    Non-medical: Not on file  Tobacco Use  . Smoking status: Never Smoker  . Smokeless tobacco: Former Network engineer and Sexual Activity  . Alcohol use: No    Frequency: Never  . Drug use: No  . Sexual activity: Not on file  Lifestyle  . Physical activity:    Days per week: Not on file    Minutes per session: Not on file  . Stress: Not on file  Relationships  . Social connections:    Talks on phone: Not on file    Gets together: Not on file    Attends religious service: Not on file    Active member of club or organization: Not on file    Attends meetings of clubs or organizations: Not on file    Relationship status: Not on file  Other Topics Concern  . Not on file  Social History Narrative   ** Merged History Encounter **         Family History: The patient's family history includes Colon cancer in his brother; Heart attack in his father; Hypertension in his father and mother; Prostate cancer in his brother; Stroke in his mother. ROS:   Please see the history of present illness.    All other systems reviewed and are negative.  EKGs/Labs/Other Studies  Reviewed:    The following studies were reviewed today:    Recent Labs: 06/18/2017: NT-Pro BNP 1,329 07/24/2017: BNP 345.0; BUN 27; Creatinine, Ser 1.31; Potassium 4.7; Sodium 140  Recent Lipid Panel No results found for: CHOL, TRIG, HDL, CHOLHDL, VLDL, LDLCALC, LDLDIRECT  Physical Exam:    VS:  BP 118/84 (BP Location: Right Arm, Patient Position: Sitting, Cuff Size: Normal)   Pulse 81   Ht 6\' 2"  (1.88 m)   Wt 171 lb 6.4 oz (77.7 kg)   SpO2 98%   BMI 22.01 kg/m     Wt Readings from Last 3 Encounters:  01/09/18 171 lb 6.4 oz (77.7 kg)  07/24/17 167 lb (75.8  kg)  07/22/17 163 lb (73.9 kg)     GEN:  Well nourished, well developed in no acute distress HEENT: Normal NECK: No JVD; No carotid bruits LYMPHATICS: No lymphadenopathy CARDIAC: Irregular irregular variable first heart sound no murmur  RESPIRATORY:  Clear to auscultation without rales, wheezing or rhonchi  ABDOMEN: Soft, non-tender, non-distended MUSCULOSKELETAL:  No edema; No deformity  SKIN: Warm and dry NEUROLOGIC:  Alert and oriented x 3 PSYCHIATRIC:  Normal affect    Signed, Shirlee More, MD  01/09/2018 12:05 PM    [place]

## 2018-01-09 NOTE — Patient Instructions (Signed)
Medication Instructions:  Your physician has recommended you make the following change in your medication:   STOP hydralazine   Labwork: Your physician recommends that you return for lab work today: BMP, ProBNP.   Testing/Procedures: None  Follow-Up: Your physician wants you to follow-up in: 6 months. You will receive a reminder letter in the mail two months in advance. If you don't receive a letter, please call our office to schedule the follow-up appointment.   If you need a refill on your cardiac medications before your next appointment, please call your pharmacy.   Thank you for choosing [facility]! Robyne Peers, RN [phone]

## 2018-01-10 LAB — BASIC METABOLIC PANEL
BUN/Creatinine Ratio: 23 (ref 10–24)
BUN: 26 mg/dL (ref 8–27)
CALCIUM: 9.6 mg/dL (ref 8.6–10.2)
CO2: 25 mmol/L (ref 20–29)
CREATININE: 1.13 mg/dL (ref 0.76–1.27)
Chloride: 99 mmol/L (ref 96–106)
GFR calc Af Amer: 72 mL/min/{1.73_m2} (ref >59)
GFR calc non Af Amer: 62 mL/min/{1.73_m2} (ref >59)
GLUCOSE: 86 mg/dL (ref 65–99)
Potassium: 4.7 mmol/L (ref 3.5–5.2)
SODIUM: 139 mmol/L (ref 134–144)

## 2018-01-10 LAB — PRO B NATRIURETIC PEPTIDE: NT-PRO BNP: 1034 pg/mL — AB (ref 0–486)

## 2018-04-06 ENCOUNTER — Other Ambulatory Visit: Payer: Self-pay | Admitting: Orthopedic Surgery

## 2018-04-21 ENCOUNTER — Inpatient Hospital Stay (HOSPITAL_COMMUNITY): Admission: RE | Admit: 2018-04-21 | Payer: Medicare Other | Source: Ambulatory Visit

## 2018-06-23 NOTE — Patient Instructions (Signed)
Verdie Barrows [facility]  06/23/2018   Your procedure is scheduled on: 06-29-18    Report to [facility] Main  Entrance    Report to Admitting at 6:55 AM    Call this number if you have problems the morning of surgery [phone]    Remember: Do not eat food or drink liquids :After Midnight.    BRUSH YOUR TEETH MORNING OF SURGERY AND RINSE YOUR MOUTH OUT, NO CHEWING GUM CANDY OR MINTS.     Take these medicines the morning of surgery with A SIP OF WATER: Metoprolol Succinate                                 You may not have any metal on your body including hair pins and              piercings  Do not wear jewelry, make-up, lotions, powders or perfumes, deodorant             Do not wear nail polish.  Do not shave  48 hours prior to surgery.          Do not bring valuables to the hospital. [place].  Contacts, dentures or bridgework may not be worn into surgery.  Leave suitcase in the car. After surgery it may be brought to your room.     Patients discharged the day of surgery will not be allowed to drive home. IF YOU ARE HAVING SURGERY AND GOING HOME THE SAME DAY, YOU MUST HAVE AN ADULT TO DRIVE YOU HOME AND BE WITH YOU FOR 24 HOURS. YOU MAY GO HOME BY TAXI OR UBER OR ORTHERWISE, BUT AN ADULT MUST ACCOMPANY YOU HOME AND STAY WITH YOU FOR 24 HOURS.   Special Instructions: N/A              Please read over the following fact sheets you were given: _____________________________________________________________________             [facility] - Preparing for Surgery Before surgery, you can play an important role.  Because skin is not sterile, your skin needs to be as free of germs as possible.  You can reduce the number of germs on your skin by washing with CHG (chlorahexidine gluconate) soap before surgery.  CHG is an antiseptic cleaner which kills germs and bonds with the skin to continue killing germs even after  washing. Please DO NOT use if you have an allergy to CHG or antibacterial soaps.  If your skin becomes reddened/irritated stop using the CHG and inform your nurse when you arrive at Short Stay. Do not shave (including legs and underarms) for at least 48 hours prior to the first CHG shower.  You may shave your face/neck. Please follow these instructions carefully:  1.  Shower with CHG Soap the night before surgery and the  morning of Surgery.  2.  If you choose to wash your hair, wash your hair first as usual with your  normal  shampoo.  3.  After you shampoo, rinse your hair and body thoroughly to remove the  shampoo.  4.  Use CHG as you would any other liquid soap.  You can apply chg directly  to the skin and wash                       Gently with a scrungie or clean washcloth.  5.  Apply the CHG Soap to your body ONLY FROM THE NECK DOWN.   Do not use on face/ open                           Wound or open sores. Avoid contact with eyes, ears mouth and genitals (private parts).                       Wash face,  Genitals (private parts) with your normal soap.             6.  Wash thoroughly, paying special attention to the area where your surgery  will be performed.  7.  Thoroughly rinse your body with warm water from the neck down.  8.  DO NOT shower/wash with your normal soap after using and rinsing off  the CHG Soap.                9.  Pat yourself dry with a clean towel.            10.  Wear clean pajamas.            11.  Place clean sheets on your bed the night of your first shower and do not  sleep with pets. Day of Surgery : Do not apply any lotions/deodorants the morning of surgery.  Please wear clean clothes to the hospital/surgery center.  FAILURE TO FOLLOW THESE INSTRUCTIONS MAY RESULT IN THE CANCELLATION OF YOUR SURGERY PATIENT SIGNATURE_________________________________  NURSE  SIGNATURE__________________________________  ________________________________________________________________________   Adam Phenix  An incentive spirometer is a tool that can help keep your lungs clear and active. This tool measures how well you are filling your lungs with each breath. Taking long deep breaths may help reverse or decrease the chance of developing breathing (pulmonary) problems (especially infection) following:  A long period of time when you are unable to move or be active. BEFORE THE PROCEDURE   If the spirometer includes an indicator to show your best effort, your nurse or respiratory therapist will set it to a desired goal.  If possible, sit up straight or lean slightly forward. Try not to slouch.  Hold the incentive spirometer in an upright position. INSTRUCTIONS FOR USE  1. Sit on the edge of your bed if possible, or sit up as far as you can in bed or on a chair. 2. Hold the incentive spirometer in an upright position. 3. Breathe out normally. 4. Place the mouthpiece in your mouth and seal your lips tightly around it. 5. Breathe in slowly and as deeply as possible, raising the piston or the ball toward the top of the column. 6. Hold your breath for 3-5 seconds or for as long as possible. Allow the piston or ball to fall to the bottom of the column. 7. Remove the mouthpiece from your mouth and breathe out normally. 8. Rest for a few seconds and repeat Steps 1 through 7 at least 10 times every 1-2 hours when you are awake. Take your time and take a few normal breaths between deep breaths. 9. The spirometer may include an indicator to show  your best effort. Use the indicator as a goal to work toward during each repetition. 10. After each set of 10 deep breaths, practice coughing to be sure your lungs are clear. If you have an incision (the cut made at the time of surgery), support your incision when coughing by placing a pillow or rolled up towels firmly  against it. Once you are able to get out of bed, walk around indoors and cough well. You may stop using the incentive spirometer when instructed by your caregiver.  RISKS AND COMPLICATIONS  Take your time so you do not get dizzy or light-headed.  If you are in pain, you may need to take or ask for pain medication before doing incentive spirometry. It is harder to take a deep breath if you are having pain. AFTER USE  Rest and breathe slowly and easily.  It can be helpful to keep track of a log of your progress. Your caregiver can provide you with a simple table to help with this. If you are using the spirometer at home, follow these instructions: [place] IF:   You are having difficultly using the spirometer.  You have trouble using the spirometer as often as instructed.  Your pain medication is not giving enough relief while using the spirometer.  You develop fever of 100.5 F (38.1 C) or higher. SEEK IMMEDIATE MEDICAL CARE IF:   You cough up bloody sputum that had not been present before.  You develop fever of 102 F (38.9 C) or greater.  You develop worsening pain at or near the incision site. MAKE SURE YOU:   Understand these instructions.  Will watch your condition.  Will get help right away if you are not doing well or get worse. Document Released: 09/02/2006 Document Revised: 07/15/2011 Document Reviewed: 11/03/2006 [facility] Patient Information 2014 [place], [place].   ________________________________________________________________________

## 2018-06-23 NOTE — Progress Notes (Addendum)
  Surgical Clearance from Dr. Unk Lightning on chart/   03-10-18 Cardiac clearance from Dr. Bettina Gavia on chart  01-09-18 (Epic) LOV w/Cardiologist  07-22-17 (Epic) STRESS

## 2018-06-24 ENCOUNTER — Encounter (HOSPITAL_COMMUNITY)
Admission: RE | Admit: 2018-06-24 | Discharge: 2018-06-24 | Disposition: A | Discharge status: Home / Self Care | Payer: Medicare Other | Source: Ambulatory Visit | Attending: Orthopedic Surgery | Admitting: Orthopedic Surgery

## 2018-06-24 ENCOUNTER — Encounter (HOSPITAL_COMMUNITY): Payer: Self-pay

## 2018-06-24 ENCOUNTER — Other Ambulatory Visit: Payer: Self-pay

## 2018-06-24 DIAGNOSIS — I4891 Unspecified atrial fibrillation: Secondary | ICD-10-CM | POA: Diagnosis not present

## 2018-06-24 DIAGNOSIS — I10 Essential (primary) hypertension: Secondary | ICD-10-CM | POA: Diagnosis not present

## 2018-06-24 DIAGNOSIS — M1712 Unilateral primary osteoarthritis, left knee: Secondary | ICD-10-CM | POA: Diagnosis not present

## 2018-06-24 DIAGNOSIS — I4510 Unspecified right bundle-branch block: Secondary | ICD-10-CM | POA: Diagnosis not present

## 2018-06-24 DIAGNOSIS — Z01818 Encounter for other preprocedural examination: Secondary | ICD-10-CM | POA: Diagnosis present

## 2018-06-24 HISTORY — DX: Other complications of anesthesia, initial encounter: T88.59XA

## 2018-06-24 HISTORY — DX: Adverse effect of unspecified anesthetic, initial encounter: T41.45XA

## 2018-06-24 HISTORY — DX: Nausea with vomiting, unspecified: R11.2

## 2018-06-24 HISTORY — DX: Other specified postprocedural states: Z98.890

## 2018-06-24 LAB — CBC WITH DIFFERENTIAL/PLATELET
Abs Immature Granulocytes: 0.02 10*3/uL (ref 0.00–0.07)
Basophils Absolute: 0 10*3/uL (ref 0.0–0.1)
Basophils Relative: 0 %
EOS PCT: 1 %
Eosinophils Absolute: 0 10*3/uL (ref 0.0–0.5)
HEMATOCRIT: 40.4 % (ref 39.0–52.0)
Hemoglobin: 12.8 g/dL — ABNORMAL LOW (ref 13.0–17.0)
Immature Granulocytes: 0 %
Lymphocytes Relative: 12 %
Lymphs Abs: 0.8 10*3/uL (ref 0.7–4.0)
MCH: 30.5 pg (ref 26.0–34.0)
MCHC: 31.7 g/dL (ref 30.0–36.0)
MCV: 96.4 fL (ref 80.0–100.0)
Monocytes Absolute: 0.7 10*3/uL (ref 0.1–1.0)
Monocytes Relative: 10 %
Neutro Abs: 5.2 10*3/uL (ref 1.7–7.7)
Neutrophils Relative %: 77 %
Platelets: 179 10*3/uL (ref 150–400)
RBC: 4.19 MIL/uL — ABNORMAL LOW (ref 4.22–5.81)
RDW: 14.2 % (ref 11.5–15.5)
WBC: 6.8 10*3/uL (ref 4.0–10.5)
nRBC: 0 % (ref 0.0–0.2)

## 2018-06-24 LAB — COMPREHENSIVE METABOLIC PANEL
ALBUMIN: 4.2 g/dL (ref 3.5–5.0)
ALT: 27 U/L (ref 0–44)
AST: 29 U/L (ref 15–41)
Alkaline Phosphatase: 46 U/L (ref 38–126)
Anion gap: 6 (ref 5–15)
BUN: 22 mg/dL (ref 8–23)
CO2: 27 mmol/L (ref 22–32)
Calcium: 8.9 mg/dL (ref 8.9–10.3)
Chloride: 104 mmol/L (ref 98–111)
Creatinine, Ser: 0.96 mg/dL (ref 0.61–1.24)
GFR calc Af Amer: >60 mL/min (ref >60)
GFR calc non Af Amer: >60 mL/min (ref >60)
GLUCOSE: 97 mg/dL (ref 70–99)
Potassium: 4.1 mmol/L (ref 3.5–5.1)
Sodium: 137 mmol/L (ref 135–145)
Total Bilirubin: 0.8 mg/dL (ref 0.3–1.2)
Total Protein: 6.8 g/dL (ref 6.5–8.1)

## 2018-06-24 LAB — SURGICAL PCR SCREEN
MRSA, PCR: NEGATIVE
Staphylococcus aureus: NEGATIVE

## 2018-06-24 NOTE — Progress Notes (Signed)
PCP: Dr. Cyndy Freeze  CARDIOLOGIST: Dr. Bettina Gavia  INFO IN Epic:  01-09-18 (Epic) LOV w/Cardiologist  07-22-17 (Epic) STRESS  INFO ON CHART:  03-10-18 Cardiac clearance from Dr. Bettina Gavia on chart   Surgical Clearance from Dr. Unk Lightning on chart    BLOOD THINNERS AND LAST DOSES:  Coumadin Last dose 2/18. Pt has transitioned to Lovenox 80 mg BID until after surgery ____________________________________  PATIENT SYMPTOMS AT TIME OF PREOP:  Hx of A-Fib, HTN

## 2018-06-25 NOTE — Anesthesia Preprocedure Evaluation (Addendum)
Anesthesia Evaluation  Patient identified by MRN, date of birth, ID band Patient awake    Reviewed: Allergy & Precautions, NPO status , Patient's Chart, lab work & pertinent test results  History of Anesthesia Complications (+) PONV  Airway Mallampati: II  TM Distance: >3 FB Neck ROM: Full    Dental  (+) Missing, Dental Advisory Given, Chipped   Pulmonary neg pulmonary ROS,    breath sounds clear to auscultation       Cardiovascular hypertension, Pt. on medications and Pt. on home beta blockers (-) angina+ dysrhythmias Atrial Fibrillation  Rhythm:Irregular Rate:Normal  3/19 Stress: EF 61%, no ischemia or infarct 2/19 ECHO: EF 40-45%, RV dilation, mild-mod MR, mod TR   Neuro/Psych negative neurological ROS     GI/Hepatic negative GI ROS, Neg liver ROS,   Endo/Other  negative endocrine ROS  Renal/GU negative Renal ROS   Prostate cancer    Musculoskeletal  (+) Arthritis , Osteoarthritis,    Abdominal   Peds  Hematology Coumadin with Lovenox bridge Last Coumadin 2/18, last bid Lovenox 2/23 19:30   Anesthesia Other Findings   Reproductive/Obstetrics                           Anesthesia Physical Anesthesia Plan  ASA: III  Anesthesia Plan: General   Post-op Pain Management: GA combined w/ Regional for post-op pain   Induction: Intravenous  PONV Risk Score and Plan: 3 and Ondansetron, Dexamethasone and Treatment may vary due to age or medical condition  Airway Management Planned: LMA  Additional Equipment:   Intra-op Plan:   Post-operative Plan:   Informed Consent: I have reviewed the patients History and Physical, chart, labs and discussed the procedure including the risks, benefits and alternatives for the proposed anesthesia with the patient or authorized representative who has indicated his/her understanding and acceptance.     Dental advisory given  Plan Discussed with:  CRNA and Surgeon  Anesthesia Plan Comments: (See PAT note 06/24/18, Konrad Felix, PA-C Plan routine monitors, GA- LMA OK, adductor canal block for post op analgesia)       Anesthesia Quick Evaluation

## 2018-06-25 NOTE — Progress Notes (Signed)
Anesthesia Chart Review   Case:  [id number] Date/Time:  06/29/18 0910   Procedure:  TOTAL KNEE ARTHROPLASTY (Left )   Anesthesia type:  Spinal   Pre-op diagnosis:  LT. KNEE OSTEOARTHRITIS   Location:  [place] 06 / [facility] ORS   Surgeon:  Vickey Huger, MD      DISCUSSION: 78 yo never smoker with h/o PONV, HTN, prostate cancer, A-fib (on Coumadin with last dose 06/23/18, Lovenox bridge prescribed) , HLD, left knee OA scheduled for above surgery 06/29/18 with Dr. Vickey Huger.    Pt last seen by cardiology on 01/09/2018, seen by Dr. Shirlee More.  Cardiac clearance received from Dr. Bettina Gavia 03/10/18 which states pt is cleared for surgery cardiac standpoint only (on chart).    Clearance also received from PCP, Dr. Unk Lightning, 06/24/18 which states pt is cleared for surgery medically only (on chart).   Pt can proceed with planned procedure barring acute status change.  VS: BP 126/79 (BP Location: Left Arm)   Pulse 84   Temp 36.7 C (Oral)   Resp 18   Ht 6\' 2"  (1.88 m)   SpO2 100%   BMI 22.01 kg/m   PROVIDERS: Cyndy Freeze, MD is PCP  Shirlee More, MD is Cardiologist  LABS: Labs reviewed: Acceptable for surgery. (all labs ordered are listed, but only abnormal results are displayed)  Labs Reviewed  CBC WITH DIFFERENTIAL/PLATELET - Abnormal; Notable for the following components:      Result Value   RBC 4.19 (*)    Hemoglobin 12.8 (*)    All other components within normal limits  SURGICAL PCR SCREEN  COMPREHENSIVE METABOLIC PANEL     IMAGES:   EKG: 06/24/2018 Rate 78 bpm Atrial fibrillation  Right bundle branch block  T wave abnormality, consider inferior ischemia   CV:  Stress Test 07/22/17  Nuclear stress EF: 61%. The left ventricular ejection fraction is normal (55-65%).  Defect 1: There is a small defect of mild severity present in the apex location. This is most consistent with apical thinning  The study is normal. no ischemia. no infarction  This is a low risk  study.  Echo 06/10/2017 Conclusions 1. The rhythm appears to be atrial flutter 2. There is moderate concentric left ventricular hypertrophy 3. There is mild global hypokinesis of LV 4. Overall left ventricular systolic function is mild-moderatley impaired with an EF between 40-45% 5. There is paradoxical/dysynergic septal motion consistent withright ventricular volume overload and/or elevated right ventricular end-diastolic pressure 6. The right ventricle is mildly enlarged 7. Paradoxical motion of the right ventricular septum is consistent with right ventricular overload and/or elevated right ventricular end-diastolic pressure 8. The left atrium is moderately dilated 9. Mild-to-moderate mitral regurgitation is present 10. Moderate tricuspid regurgitation present  Past Medical History:  Diagnosis Date  . Atrial fibrillation ([place]) 06/17/2017  . Chronic atrial fibrillation 12/20/2014   Overview:  .CHADS2 vasc score=2  . Complication of anesthesia   . Essential hypertension 12/20/2014  . Hyperlipidemia 06/17/2017  . Hypertension 06/17/2017  . Long term (current) use of anticoagulants 12/20/2014  . PONV (postoperative nausea and vomiting)   . Prostate cancer ([place])   . Short of breath on exertion 06/17/2017  . Weakness 06/17/2017    Past Surgical History:  Procedure Laterality Date  . HERNIA REPAIR    . PROSTATECTOMY    . TONSILLECTOMY AND ADENOIDECTOMY      MEDICATIONS: . enoxaparin (LOVENOX) 80 MG/0.8ML injection  . Cyanocobalamin (VITAMIN B-12 PO)  . furosemide (LASIX) 40 MG  tablet  . Metoprolol Succinate 100 MG CS24  . olmesartan (BENICAR) 20 MG tablet  . spironolactone (ALDACTONE) 25 MG tablet  . vitamin C (ASCORBIC ACID) 500 MG tablet  . VITAMIN E PO  . warfarin (COUMADIN) 3 MG tablet   No current facility-administered medications for this encounter.      Maia Plan [facility] Pre-Surgical Testing [phone] 06/25/18 12:18 PM

## 2018-06-28 MED ORDER — BUPIVACAINE LIPOSOME 1.3 % IJ SUSP
20.0000 mL | INTRAMUSCULAR | Status: DC
  Filled 2018-06-28: qty 20

## 2018-06-29 ENCOUNTER — Encounter (HOSPITAL_COMMUNITY)
Admission: RE | Disposition: A | Discharge status: Home / Self Care | Payer: Self-pay | Source: Home / Self Care | Attending: Orthopedic Surgery

## 2018-06-29 ENCOUNTER — Other Ambulatory Visit: Payer: Self-pay

## 2018-06-29 ENCOUNTER — Encounter (HOSPITAL_COMMUNITY): Payer: Self-pay | Admitting: Anesthesiology

## 2018-06-29 ENCOUNTER — Ambulatory Visit (HOSPITAL_COMMUNITY): Payer: Medicare Other | Admitting: Anesthesiology

## 2018-06-29 ENCOUNTER — Ambulatory Visit (HOSPITAL_COMMUNITY): Payer: Medicare Other | Admitting: Physician Assistant

## 2018-06-29 ENCOUNTER — Observation Stay (HOSPITAL_COMMUNITY)
Admission: RE | Admit: 2018-06-29 | Discharge: 2018-06-30 | Disposition: A | Discharge status: Home / Self Care | Payer: Medicare Other | Attending: Orthopedic Surgery | Admitting: Orthopedic Surgery

## 2018-06-29 DIAGNOSIS — Z7901 Long term (current) use of anticoagulants: Secondary | ICD-10-CM | POA: Insufficient documentation

## 2018-06-29 DIAGNOSIS — Z79899 Other long term (current) drug therapy: Secondary | ICD-10-CM | POA: Insufficient documentation

## 2018-06-29 DIAGNOSIS — E785 Hyperlipidemia, unspecified: Secondary | ICD-10-CM | POA: Insufficient documentation

## 2018-06-29 DIAGNOSIS — I4891 Unspecified atrial fibrillation: Secondary | ICD-10-CM | POA: Insufficient documentation

## 2018-06-29 DIAGNOSIS — Z96659 Presence of unspecified artificial knee joint: Secondary | ICD-10-CM

## 2018-06-29 DIAGNOSIS — Z87891 Personal history of nicotine dependence: Secondary | ICD-10-CM | POA: Insufficient documentation

## 2018-06-29 DIAGNOSIS — I5032 Chronic diastolic (congestive) heart failure: Secondary | ICD-10-CM | POA: Insufficient documentation

## 2018-06-29 DIAGNOSIS — I482 Chronic atrial fibrillation, unspecified: Secondary | ICD-10-CM | POA: Insufficient documentation

## 2018-06-29 DIAGNOSIS — M1712 Unilateral primary osteoarthritis, left knee: Secondary | ICD-10-CM | POA: Diagnosis present

## 2018-06-29 DIAGNOSIS — Z880 Allergy status to penicillin: Secondary | ICD-10-CM | POA: Insufficient documentation

## 2018-06-29 DIAGNOSIS — Z8546 Personal history of malignant neoplasm of prostate: Secondary | ICD-10-CM | POA: Insufficient documentation

## 2018-06-29 DIAGNOSIS — I110 Hypertensive heart disease with heart failure: Secondary | ICD-10-CM | POA: Insufficient documentation

## 2018-06-29 DIAGNOSIS — I10 Essential (primary) hypertension: Secondary | ICD-10-CM | POA: Diagnosis not present

## 2018-06-29 HISTORY — PX: TOTAL KNEE ARTHROPLASTY: SHX125

## 2018-06-29 LAB — CBC
HEMATOCRIT: 36 % — AB (ref 39.0–52.0)
Hemoglobin: 11.6 g/dL — ABNORMAL LOW (ref 13.0–17.0)
MCH: 31.9 pg (ref 26.0–34.0)
MCHC: 32.2 g/dL (ref 30.0–36.0)
MCV: 98.9 fL (ref 80.0–100.0)
Platelets: 145 10*3/uL — ABNORMAL LOW (ref 150–400)
RBC: 3.64 MIL/uL — ABNORMAL LOW (ref 4.22–5.81)
RDW: 14.3 % (ref 11.5–15.5)
WBC: 5.5 10*3/uL (ref 4.0–10.5)
nRBC: 0.4 % — ABNORMAL HIGH (ref 0.0–0.2)

## 2018-06-29 LAB — CREATININE, SERUM
Creatinine, Ser: 1.1 mg/dL (ref 0.61–1.24)
GFR calc Af Amer: >60 mL/min (ref >60)
GFR calc non Af Amer: >60 mL/min (ref >60)

## 2018-06-29 SURGERY — ARTHROPLASTY, KNEE, TOTAL
Anesthesia: General | Site: Knee | Laterality: Left

## 2018-06-29 MED ORDER — GABAPENTIN 300 MG PO CAPS
300.0000 mg | ORAL_CAPSULE | Freq: Once | ORAL | Status: AC
  Administered 2018-06-29: 300 mg via ORAL
  Filled 2018-06-29: qty 1

## 2018-06-29 MED ORDER — PHENYLEPHRINE 40 MCG/ML (10ML) SYRINGE FOR IV PUSH (FOR BLOOD PRESSURE SUPPORT)
PREFILLED_SYRINGE | INTRAVENOUS | Status: AC
  Filled 2018-06-29: qty 10

## 2018-06-29 MED ORDER — SPIRONOLACTONE 12.5 MG HALF TABLET
12.5000 mg | ORAL_TABLET | Freq: Every day | ORAL | Status: DC
  Administered 2018-06-30: 12.5 mg via ORAL
  Filled 2018-06-29: qty 1

## 2018-06-29 MED ORDER — MIDAZOLAM HCL 2 MG/2ML IJ SOLN
1.0000 mg | Freq: Once | INTRAMUSCULAR | Status: AC
  Administered 2018-06-29: 1 mg via INTRAVENOUS
  Filled 2018-06-29: qty 2

## 2018-06-29 MED ORDER — TRANEXAMIC ACID-NACL 1000-0.7 MG/100ML-% IV SOLN
1000.0000 mg | INTRAVENOUS | Status: AC
  Administered 2018-06-29: 1000 mg via INTRAVENOUS
  Filled 2018-06-29: qty 100

## 2018-06-29 MED ORDER — TRANEXAMIC ACID-NACL 1000-0.7 MG/100ML-% IV SOLN
1000.0000 mg | Freq: Once | INTRAVENOUS | Status: AC
  Administered 2018-06-29: 1000 mg via INTRAVENOUS
  Filled 2018-06-29: qty 100

## 2018-06-29 MED ORDER — LIDOCAINE 2% (20 MG/ML) 5 ML SYRINGE
INTRAMUSCULAR | Status: DC | PRN
  Administered 2018-06-29: 80 mg via INTRAVENOUS

## 2018-06-29 MED ORDER — MENTHOL 3 MG MT LOZG: 1.0000 | LOZENGE | OROMUCOSAL | Status: DC | PRN

## 2018-06-29 MED ORDER — HYDROMORPHONE HCL 1 MG/ML IJ SOLN: 0.5000 mg | INTRAMUSCULAR | Status: DC | PRN

## 2018-06-29 MED ORDER — METHOCARBAMOL 500 MG IVPB - SIMPLE MED
INTRAVENOUS | Status: AC
  Filled 2018-06-29: qty 50

## 2018-06-29 MED ORDER — METOCLOPRAMIDE HCL 5 MG/ML IJ SOLN: 5.0000 mg | Freq: Three times a day (TID) | INTRAMUSCULAR | Status: DC | PRN

## 2018-06-29 MED ORDER — ROPIVACAINE HCL 7.5 MG/ML IJ SOLN
INTRAMUSCULAR | Status: DC | PRN
  Administered 2018-06-29: 20 mL via PERINEURAL

## 2018-06-29 MED ORDER — SODIUM CHLORIDE 0.9 % IR SOLN
Status: DC | PRN
  Administered 2018-06-29 (×2): 1000 mL

## 2018-06-29 MED ORDER — FUROSEMIDE 40 MG PO TABS
40.0000 mg | ORAL_TABLET | Freq: Every day | ORAL | Status: DC
  Administered 2018-06-29 – 2018-06-30 (×2): 40 mg via ORAL
  Filled 2018-06-29 (×2): qty 1

## 2018-06-29 MED ORDER — PHENOL 1.4 % MT LIQD: 1.0000 | OROMUCOSAL | Status: DC | PRN

## 2018-06-29 MED ORDER — WARFARIN - PHARMACIST DOSING INPATIENT
Freq: Every day | Status: DC
  Administered 2018-06-29: 17:00:00

## 2018-06-29 MED ORDER — FERROUS SULFATE 325 (65 FE) MG PO TABS
325.0000 mg | ORAL_TABLET | Freq: Three times a day (TID) | ORAL | Status: DC
  Administered 2018-06-29 – 2018-06-30 (×4): 325 mg via ORAL
  Filled 2018-06-29 (×4): qty 1

## 2018-06-29 MED ORDER — CHLORHEXIDINE GLUCONATE 4 % EX LIQD: 60.0000 mL | Freq: Once | CUTANEOUS | Status: DC

## 2018-06-29 MED ORDER — LIDOCAINE 2% (20 MG/ML) 5 ML SYRINGE
INTRAMUSCULAR | Status: AC
  Filled 2018-06-29: qty 5

## 2018-06-29 MED ORDER — ONDANSETRON HCL 4 MG PO TABS: 4.0000 mg | ORAL_TABLET | Freq: Four times a day (QID) | ORAL | Status: DC | PRN

## 2018-06-29 MED ORDER — PROPOFOL 10 MG/ML IV BOLUS
INTRAVENOUS | Status: AC
  Filled 2018-06-29: qty 60

## 2018-06-29 MED ORDER — GABAPENTIN 300 MG PO CAPS
300.0000 mg | ORAL_CAPSULE | Freq: Three times a day (TID) | ORAL | Status: DC
  Administered 2018-06-29 – 2018-06-30 (×4): 300 mg via ORAL
  Filled 2018-06-29 (×5): qty 1

## 2018-06-29 MED ORDER — PROPOFOL 10 MG/ML IV BOLUS
INTRAVENOUS | Status: AC
  Filled 2018-06-29: qty 20

## 2018-06-29 MED ORDER — ZOLPIDEM TARTRATE 5 MG PO TABS: 5.0000 mg | ORAL_TABLET | Freq: Every evening | ORAL | Status: DC | PRN

## 2018-06-29 MED ORDER — BUPIVACAINE LIPOSOME 1.3 % IJ SUSP
INTRAMUSCULAR | Status: DC | PRN
  Administered 2018-06-29: 20 mL

## 2018-06-29 MED ORDER — EPHEDRINE 5 MG/ML INJ
INTRAVENOUS | Status: AC
  Filled 2018-06-29: qty 10

## 2018-06-29 MED ORDER — BISACODYL 5 MG PO TBEC: 5.0000 mg | DELAYED_RELEASE_TABLET | Freq: Every day | ORAL | Status: DC | PRN

## 2018-06-29 MED ORDER — ACETAMINOPHEN 500 MG PO TABS
1000.0000 mg | ORAL_TABLET | Freq: Four times a day (QID) | ORAL | Status: AC
  Administered 2018-06-29 – 2018-06-30 (×3): 1000 mg via ORAL
  Filled 2018-06-29 (×4): qty 2

## 2018-06-29 MED ORDER — HYDROMORPHONE HCL 1 MG/ML IJ SOLN
0.2500 mg | INTRAMUSCULAR | Status: DC | PRN
  Administered 2018-06-29 (×2): 0.5 mg via INTRAVENOUS

## 2018-06-29 MED ORDER — ONDANSETRON HCL 4 MG/2ML IJ SOLN: 4.0000 mg | Freq: Four times a day (QID) | INTRAMUSCULAR | Status: DC | PRN

## 2018-06-29 MED ORDER — SODIUM CHLORIDE 0.9% FLUSH
INTRAVENOUS | Status: DC | PRN
  Administered 2018-06-29: 20 mL

## 2018-06-29 MED ORDER — OXYCODONE HCL 5 MG PO TABS: 5.0000 mg | ORAL_TABLET | ORAL | Status: DC | PRN

## 2018-06-29 MED ORDER — SENNOSIDES-DOCUSATE SODIUM 8.6-50 MG PO TABS: 1.0000 | ORAL_TABLET | Freq: Every evening | ORAL | Status: DC | PRN

## 2018-06-29 MED ORDER — FENTANYL CITRATE (PF) 100 MCG/2ML IJ SOLN
INTRAMUSCULAR | Status: AC
  Filled 2018-06-29: qty 2

## 2018-06-29 MED ORDER — METHOCARBAMOL 500 MG IVPB - SIMPLE MED
500.0000 mg | Freq: Four times a day (QID) | INTRAVENOUS | Status: DC | PRN
  Administered 2018-06-29: 500 mg via INTRAVENOUS
  Filled 2018-06-29: qty 50

## 2018-06-29 MED ORDER — LACTATED RINGERS IV SOLN
INTRAVENOUS | Status: DC
  Administered 2018-06-29: 08:00:00 via INTRAVENOUS

## 2018-06-29 MED ORDER — PHENYLEPHRINE 40 MCG/ML (10ML) SYRINGE FOR IV PUSH (FOR BLOOD PRESSURE SUPPORT)
PREFILLED_SYRINGE | INTRAVENOUS | Status: DC | PRN
  Administered 2018-06-29 (×6): 80 ug via INTRAVENOUS

## 2018-06-29 MED ORDER — FENTANYL CITRATE (PF) 100 MCG/2ML IJ SOLN
INTRAMUSCULAR | Status: DC | PRN
  Administered 2018-06-29: 25 ug via INTRAVENOUS
  Administered 2018-06-29: 50 ug via INTRAVENOUS
  Administered 2018-06-29: 25 ug via INTRAVENOUS

## 2018-06-29 MED ORDER — PANTOPRAZOLE SODIUM 40 MG PO TBEC
40.0000 mg | DELAYED_RELEASE_TABLET | Freq: Every day | ORAL | Status: DC
  Administered 2018-06-29 – 2018-06-30 (×2): 40 mg via ORAL
  Filled 2018-06-29 (×2): qty 1

## 2018-06-29 MED ORDER — EPHEDRINE SULFATE-NACL 50-0.9 MG/10ML-% IV SOSY
PREFILLED_SYRINGE | INTRAVENOUS | Status: DC | PRN
  Administered 2018-06-29 (×2): 5 mg via INTRAVENOUS

## 2018-06-29 MED ORDER — MIDAZOLAM HCL 2 MG/2ML IJ SOLN
INTRAMUSCULAR | Status: AC
  Administered 2018-06-29: 1 mg via INTRAVENOUS
  Filled 2018-06-29: qty 2

## 2018-06-29 MED ORDER — DEXAMETHASONE SODIUM PHOSPHATE 10 MG/ML IJ SOLN
8.0000 mg | Freq: Once | INTRAMUSCULAR | Status: AC
  Administered 2018-06-29: 8 mg via INTRAVENOUS

## 2018-06-29 MED ORDER — ALUM & MAG HYDROXIDE-SIMETH 200-200-20 MG/5ML PO SUSP: 30.0000 mL | ORAL | Status: DC | PRN

## 2018-06-29 MED ORDER — SODIUM CHLORIDE 0.9 % IV SOLN
INTRAVENOUS | Status: DC
  Administered 2018-06-29: 13:00:00 via INTRAVENOUS

## 2018-06-29 MED ORDER — TRAMADOL HCL 50 MG PO TABS
50.0000 mg | ORAL_TABLET | Freq: Four times a day (QID) | ORAL | Status: DC
  Administered 2018-06-29 – 2018-06-30 (×5): 50 mg via ORAL
  Filled 2018-06-29 (×5): qty 1

## 2018-06-29 MED ORDER — DEXAMETHASONE SODIUM PHOSPHATE 10 MG/ML IJ SOLN
10.0000 mg | Freq: Once | INTRAMUSCULAR | Status: AC
  Administered 2018-06-30: 10 mg via INTRAVENOUS
  Filled 2018-06-29: qty 1

## 2018-06-29 MED ORDER — HYDROMORPHONE HCL 1 MG/ML IJ SOLN
INTRAMUSCULAR | Status: AC
  Filled 2018-06-29: qty 1

## 2018-06-29 MED ORDER — MIDAZOLAM HCL 2 MG/2ML IJ SOLN: 0.5000 mg | Freq: Once | INTRAMUSCULAR | Status: DC | PRN

## 2018-06-29 MED ORDER — FENTANYL CITRATE (PF) 100 MCG/2ML IJ SOLN
50.0000 ug | Freq: Once | INTRAMUSCULAR | Status: AC
  Administered 2018-06-29: 50 ug via INTRAVENOUS
  Filled 2018-06-29: qty 2

## 2018-06-29 MED ORDER — CEFAZOLIN SODIUM-DEXTROSE 2-4 GM/100ML-% IV SOLN
2.0000 g | INTRAVENOUS | Status: AC
  Administered 2018-06-29: 2 g via INTRAVENOUS
  Filled 2018-06-29: qty 100

## 2018-06-29 MED ORDER — ONDANSETRON HCL 4 MG/2ML IJ SOLN
INTRAMUSCULAR | Status: AC
  Filled 2018-06-29: qty 4

## 2018-06-29 MED ORDER — PROMETHAZINE HCL 25 MG/ML IJ SOLN: 6.2500 mg | INTRAMUSCULAR | Status: DC | PRN

## 2018-06-29 MED ORDER — CEFAZOLIN SODIUM-DEXTROSE 2-4 GM/100ML-% IV SOLN
2.0000 g | Freq: Four times a day (QID) | INTRAVENOUS | Status: AC
  Administered 2018-06-29 (×2): 2 g via INTRAVENOUS
  Filled 2018-06-29 (×2): qty 100

## 2018-06-29 MED ORDER — FLEET ENEMA 7-19 GM/118ML RE ENEM: 1.0000 | ENEMA | Freq: Once | RECTAL | Status: DC | PRN

## 2018-06-29 MED ORDER — ENOXAPARIN SODIUM 40 MG/0.4ML ~~LOC~~ SOLN
40.0000 mg | SUBCUTANEOUS | Status: DC
  Administered 2018-06-30: 40 mg via SUBCUTANEOUS
  Filled 2018-06-29: qty 0.4

## 2018-06-29 MED ORDER — BUPIVACAINE-EPINEPHRINE 0.5% -1:200000 IJ SOLN
INTRAMUSCULAR | Status: DC | PRN
  Administered 2018-06-29: 30 mL

## 2018-06-29 MED ORDER — LACTATED RINGERS IV SOLN: INTRAVENOUS | Status: DC

## 2018-06-29 MED ORDER — DEXAMETHASONE SODIUM PHOSPHATE 10 MG/ML IJ SOLN
INTRAMUSCULAR | Status: AC
  Filled 2018-06-29: qty 2

## 2018-06-29 MED ORDER — FENTANYL CITRATE (PF) 100 MCG/2ML IJ SOLN
INTRAMUSCULAR | Status: AC
  Administered 2018-06-29: 50 ug via INTRAVENOUS
  Filled 2018-06-29: qty 2

## 2018-06-29 MED ORDER — DIPHENHYDRAMINE HCL 12.5 MG/5ML PO ELIX: 12.5000 mg | ORAL_SOLUTION | ORAL | Status: DC | PRN

## 2018-06-29 MED ORDER — IRBESARTAN 150 MG PO TABS
150.0000 mg | ORAL_TABLET | Freq: Every day | ORAL | Status: DC
  Administered 2018-06-29 – 2018-06-30 (×2): 150 mg via ORAL
  Filled 2018-06-29 (×2): qty 1

## 2018-06-29 MED ORDER — METHOCARBAMOL 500 MG PO TABS: 500.0000 mg | ORAL_TABLET | Freq: Four times a day (QID) | ORAL | Status: DC | PRN

## 2018-06-29 MED ORDER — STERILE WATER FOR INJECTION IJ SOLN
INTRAMUSCULAR | Status: DC | PRN
  Administered 2018-06-29: 2000 mL

## 2018-06-29 MED ORDER — DOCUSATE SODIUM 100 MG PO CAPS
100.0000 mg | ORAL_CAPSULE | Freq: Two times a day (BID) | ORAL | Status: DC
  Administered 2018-06-29 – 2018-06-30 (×3): 100 mg via ORAL
  Filled 2018-06-29 (×3): qty 1

## 2018-06-29 MED ORDER — METOCLOPRAMIDE HCL 5 MG PO TABS: 5.0000 mg | ORAL_TABLET | Freq: Three times a day (TID) | ORAL | Status: DC | PRN

## 2018-06-29 MED ORDER — SODIUM CHLORIDE (PF) 0.9 % IJ SOLN
INTRAMUSCULAR | Status: AC
  Filled 2018-06-29: qty 20

## 2018-06-29 MED ORDER — BUPIVACAINE-EPINEPHRINE (PF) 0.5% -1:200000 IJ SOLN
INTRAMUSCULAR | Status: AC
  Filled 2018-06-29: qty 30

## 2018-06-29 MED ORDER — ACETAMINOPHEN 500 MG PO TABS
1000.0000 mg | ORAL_TABLET | Freq: Once | ORAL | Status: AC
  Administered 2018-06-29: 1000 mg via ORAL
  Filled 2018-06-29: qty 2

## 2018-06-29 MED ORDER — MEPERIDINE HCL 50 MG/ML IJ SOLN: 6.2500 mg | INTRAMUSCULAR | Status: DC | PRN

## 2018-06-29 MED ORDER — WARFARIN SODIUM 5 MG PO TABS
5.0000 mg | ORAL_TABLET | Freq: Once | ORAL | Status: AC
  Administered 2018-06-29: 5 mg via ORAL
  Filled 2018-06-29: qty 1

## 2018-06-29 MED ORDER — ONDANSETRON HCL 4 MG/2ML IJ SOLN
INTRAMUSCULAR | Status: DC | PRN
  Administered 2018-06-29: 4 mg via INTRAVENOUS

## 2018-06-29 MED ORDER — PROPOFOL 10 MG/ML IV BOLUS
INTRAVENOUS | Status: DC | PRN
  Administered 2018-06-29: 200 mg via INTRAVENOUS

## 2018-06-29 SURGICAL SUPPLY — 60 items
BAG SPEC THK2 15X12 ZIP CLS (MISCELLANEOUS) ×1
BAG ZIPLOCK 12X15 (MISCELLANEOUS) ×3 IMPLANT
BANDAGE ACE 6X5 VEL STRL LF (GAUZE/BANDAGES/DRESSINGS) ×3 IMPLANT
BANDAGE ELASTIC 6 VELCRO ST LF (GAUZE/BANDAGES/DRESSINGS) ×2 IMPLANT
BLADE SAGITTAL 13X1.27X60 (BLADE) ×2 IMPLANT
BLADE SAGITTAL 13X1.27X60MM (BLADE) ×1
BLADE SAW SGTL 83.5X18.5 (BLADE) ×3 IMPLANT
BLADE SURG 15 STRL LF DISP TIS (BLADE) ×1 IMPLANT
BLADE SURG 15 STRL SS (BLADE) ×3
BLADE SURG SZ10 CARB STEEL (BLADE) ×6 IMPLANT
BOWL SMART MIX CTS (DISPOSABLE) ×3 IMPLANT
BSPLAT TIB 5D G CMNT STM LT (Knees) ×1 IMPLANT
CEMENT BONE SIMPLEX SPEEDSET (Cement) ×6 IMPLANT
CLOSURE WOUND 1/2 X4 (GAUZE/BANDAGES/DRESSINGS) ×1
COVER SURGICAL LIGHT HANDLE (MISCELLANEOUS) ×3 IMPLANT
COVER WAND RF STERILE (DRAPES) IMPLANT
CUFF TOURN SGL QUICK 34 (TOURNIQUET CUFF) ×3
CUFF TRNQT CYL 34X4.125X (TOURNIQUET CUFF) ×1 IMPLANT
DECANTER SPIKE VIAL GLASS SM (MISCELLANEOUS) ×6 IMPLANT
DRAPE INCISE IOBAN 66X45 STRL (DRAPES) ×6 IMPLANT
DRAPE U-SHAPE 47X51 STRL (DRAPES) ×3 IMPLANT
DRSG AQUACEL AG ADV 3.5X10 (GAUZE/BANDAGES/DRESSINGS) ×3 IMPLANT
DURAPREP 26ML APPLICATOR (WOUND CARE) ×6 IMPLANT
ELECT REM PT RETURN 15FT ADLT (MISCELLANEOUS) ×3 IMPLANT
FEMUR  CMT CCR STD SZ10 L KNEE (Knees) ×2 IMPLANT
FEMUR CMT CCR STD SZ10 L KNEE (Knees) ×1 IMPLANT
FEMUR CMTD CR PERS STD SZ 5 RT (Knees) IMPLANT
GLOVE BIOGEL M STRL SZ7.5 (GLOVE) ×3 IMPLANT
GLOVE BIOGEL PI IND STRL 7.5 (GLOVE) ×1 IMPLANT
GLOVE BIOGEL PI IND STRL 8.5 (GLOVE) ×2 IMPLANT
GLOVE BIOGEL PI INDICATOR 7.5 (GLOVE) ×2
GLOVE BIOGEL PI INDICATOR 8.5 (GLOVE) ×4
GLOVE SURG ORTHO 8.0 STRL STRW (GLOVE) ×9 IMPLANT
GOWN STRL REUS W/ TWL XL LVL3 (GOWN DISPOSABLE) ×2 IMPLANT
GOWN STRL REUS W/TWL XL LVL3 (GOWN DISPOSABLE) ×6
HANDPIECE INTERPULSE COAX TIP (DISPOSABLE) ×3
HOLDER FOLEY CATH W/STRAP (MISCELLANEOUS) ×3 IMPLANT
HOOD PEEL AWAY FLYTE STAYCOOL (MISCELLANEOUS) ×9 IMPLANT
INSERT TIBIAL PLY L CD10-12X11 (Knees) ×2 IMPLANT
MANIFOLD NEPTUNE II (INSTRUMENTS) ×3 IMPLANT
NEEDLE HYPO 22GX1.5 SAFETY (NEEDLE) ×3 IMPLANT
NS IRRIG 1000ML POUR BTL (IV SOLUTION) ×3 IMPLANT
PACK TOTAL KNEE CUSTOM (KITS) ×3 IMPLANT
PROTECTOR NERVE ULNAR (MISCELLANEOUS) ×3 IMPLANT
SET HNDPC FAN SPRY TIP SCT (DISPOSABLE) ×1 IMPLANT
STEM POLY PAT PLY 35M KNEE (Knees) ×2 IMPLANT
STEM TIBIA 5 DEG SZ G L KNEE (Knees) IMPLANT
STRIP CLOSURE SKIN 1/2X4 (GAUZE/BANDAGES/DRESSINGS) ×2 IMPLANT
SUT BONE WAX W31G (SUTURE) ×3 IMPLANT
SUT MNCRL AB 3-0 PS2 18 (SUTURE) ×3 IMPLANT
SUT STRATAFIX 0 PDS 27 VIOLET (SUTURE) ×3
SUT STRATAFIX PDS+ 0 24IN (SUTURE) ×3 IMPLANT
SUT VIC AB 1 CT1 36 (SUTURE) ×3 IMPLANT
SUTURE STRATFX 0 PDS 27 VIOLET (SUTURE) ×1 IMPLANT
SYR CONTROL 10ML LL (SYRINGE) ×6 IMPLANT
TIBIA STEM 5 DEG SZ G L KNEE (Knees) ×3 IMPLANT
TRAY FOLEY MTR SLVR 16FR STAT (SET/KITS/TRAYS/PACK) ×3 IMPLANT
WATER STERILE IRR 1000ML POUR (IV SOLUTION) ×6 IMPLANT
WRAP KNEE MAXI GEL POST OP (GAUZE/BANDAGES/DRESSINGS) ×3 IMPLANT
YANKAUER SUCT BULB TIP 10FT TU (MISCELLANEOUS) ×3 IMPLANT

## 2018-06-29 NOTE — Anesthesia Procedure Notes (Signed)
Procedure Name: LMA Insertion Date/Time: 06/29/2018 9:39 AM Performed by: Lavina Hamman, CRNA Pre-anesthesia Checklist: Patient identified, Emergency Drugs available, Suction available and Patient being monitored Patient Re-evaluated:Patient Re-evaluated prior to induction Oxygen Delivery Method: Circle System Utilized Preoxygenation: Pre-oxygenation with 100% oxygen Induction Type: IV induction Ventilation: Mask ventilation without difficulty LMA: LMA with gastric port inserted LMA Size: 5.0 Number of attempts: 1 Airway Equipment and Method: Bite block Placement Confirmation: positive ETCO2 Tube secured with: Tape Dental Injury: Teeth and Oropharynx as per pre-operative assessment

## 2018-06-29 NOTE — Progress Notes (Signed)
[place] for warfarin Indication: S/P TKA, hx of afib   Allergies  Allergen Reactions  . Penicillins Swelling and Other (See Comments)    Has patient had a PCN reaction causing immediate rash, facial/tongue/throat swelling, SOB or lightheadedness with hypotension: Unknown Has patient had a PCN reaction causing severe rash involving mucus membranes or skin necrosis: Unknown Has patient had a PCN reaction that required hospitalization: Unknown Has patient had a PCN reaction occurring within the last 10 years: No If all of the above answers are "NO", then may proceed with Cephalosporin use.     Patient Measurements: Height: 6\' 2"  (188 cm) Weight: 166 lb (75.3 kg) IBW/kg (Calculated) : 82.2 Heparin Dosing Weight:   Vital Signs: Temp: 98.2 F (36.8 C) (02/24 1145) Temp Source: Oral (02/24 0704) BP: 131/80 (02/24 1145) Pulse Rate: 80 (02/24 1145)  Labs: Recent Labs    06/29/18 1209  HGB 11.6*  HCT 36.0*  PLT 145*  CREATININE 1.10    Estimated Creatinine Clearance: 58.9 mL/min (by C-G formula based on SCr of 1.1 mg/dL).   Medications:  Scheduled:  . acetaminophen  1,000 mg Oral Q6H  . [START ON 06/30/2018] dexamethasone  10 mg Intravenous Once  . docusate sodium  100 mg Oral BID  . [START ON 06/30/2018] enoxaparin (LOVENOX) injection  40 mg Subcutaneous Q24H  . ferrous sulfate  325 mg Oral TID PC  . furosemide  40 mg Oral Daily  . gabapentin  300 mg Oral TID  . HYDROmorphone      . irbesartan  150 mg Oral Daily  . pantoprazole  40 mg Oral Daily  . spironolactone  12.5 mg Oral Daily  . traMADol  50 mg Oral Q6H    Assessment: Pharmacy is consulted to dose warfarin in 78 yo male that is s/p TKA. Pt was on warfarin PTA for hx of afib. Pt was bridged prior to surgery with enoxaparin 80 mg [place] q12h.   Home regimen is warfarin 3 mg and 4 mg tablets once a day on alternating days.   Today, 06/29/18   Hgb 11.6, plt 145 -  pt just pot-op  INR ordered for tommorow  No new interacting meds started     Goal of Therapy:  INR 2-3 Monitor platelets by anticoagulation protocol: Yes   Plan:   Warfarin 5 mg PO x 1   Daily PT/INR  Pt also on enoxaparin 40 mg [place] daily. Per order, discontinue when INR >/= 2.0.  Monitor for signs and symptoms of bleeding  Royetta Asal, PharmD, BCPS Pager [phone] 06/29/2018 12:46 PM

## 2018-06-29 NOTE — Progress Notes (Signed)
Assisted Dr. Carswell Jackson with left, ultrasound guided, adductor canal block. Side rails up, monitors on throughout procedure. See vital signs in flow sheet. Tolerated Procedure well.  

## 2018-06-29 NOTE — Transfer of Care (Signed)
Immediate Anesthesia Transfer of Care Note  Patient: [facility]  Procedure(s) Performed: TOTAL KNEE ARTHROPLASTY (Left Knee)  Patient Location: PACU  Anesthesia Type:General  Level of Consciousness: awake, alert , oriented and patient cooperative  Airway & Oxygen Therapy: Patient Spontanous Breathing and Patient connected to face mask oxygen  Post-op Assessment: Report given to RN, Post -op Vital signs reviewed and stable and Patient moving all extremities  Post vital signs: Reviewed and stable  Last Vitals:  Vitals Value Taken Time  BP 131/93 06/29/2018 10:55 AM  Temp    Pulse 94 06/29/2018 10:57 AM  Resp 18 06/29/2018 10:57 AM  SpO2 100 % 06/29/2018 10:57 AM  Vitals shown include unvalidated device data.  Last Pain:  Vitals:   06/29/18 0704  TempSrc: Oral      Patients Stated Pain Goal: 3 (67/89/38 1017)  Complications: No apparent anesthesia complications

## 2018-06-29 NOTE — H&P (Signed)
Lee Strong [facility] MRN:  [id number] DOB/SEX:  02-10-1941/male  CHIEF COMPLAINT:  Painful left Knee  HISTORY: Patient is a 78 y.o. male presented with a history of pain in the left knee. Onset of symptoms was gradual starting a few years ago with gradually worsening course since that time. Patient has been treated conservatively with over-the-counter NSAIDs and activity modification. Patient currently rates pain in the knee at 10 out of 10 with activity. There is pain at night  PAST MEDICAL HISTORY: Patient Active Problem List   Diagnosis Date Noted  . On amiodarone therapy 06/18/2017  . High risk medication use 06/18/2017  . Chronic atrial fibrillation 06/17/2017  . 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 Hyperlipidemia 06/17/2017  . Short of breath on exertion 06/17/2017  . Weakness 06/17/2017  . Chronic diastolic heart failure ([place]) 06/17/2017  . Right ventricular dysfunction 06/17/2017  . Coronary artery calcification 06/17/2017  . Chronic anticoagulation 06/17/2017  . Essential hypertension 12/20/2014  . Long term (current) use of anticoagulants 12/20/2014   Past Medical History:  Diagnosis Date  . Atrial fibrillation ([place]) 06/17/2017  . Chronic atrial fibrillation 12/20/2014   Overview:  .CHADS2 vasc score=2  . Complication of anesthesia   . Essential hypertension 12/20/2014  . Hyperlipidemia 06/17/2017  . Hypertension 06/17/2017  . Long term (current) use of anticoagulants 12/20/2014  . PONV (postoperative nausea and vomiting)   . Prostate cancer ([place])   . Short of breath on exertion 06/17/2017  . Weakness 06/17/2017   Past Surgical History:  Procedure Laterality Date  . HERNIA REPAIR    . PROSTATECTOMY    . TONSILLECTOMY AND ADENOIDECTOMY       MEDICATIONS:   No medications prior to admission.    ALLERGIES:   Allergies  Allergen Reactions  . Penicillins Swelling and Other (See Comments)    Has patient had a PCN reaction causing immediate  rash, facial/tongue/throat swelling, SOB or lightheadedness with hypotension: Unknown Has patient had a PCN reaction causing severe rash involving mucus membranes or skin necrosis: Unknown Has patient had a PCN reaction that required hospitalization: Unknown Has patient had a PCN reaction occurring within the last 10 years: No If all of the above answers are "NO", then may proceed with Cephalosporin use.     REVIEW OF SYSTEMS:  A comprehensive review of systems was negative except for: Musculoskeletal: positive for arthralgias and bone pain   FAMILY HISTORY:   Family History  Problem Relation Age of Onset  . Stroke Mother   . Heart attack Father   . Colon cancer Brother   . Prostate cancer Brother   . Hypertension Mother   . Hypertension Father     SOCIAL HISTORY:   Social History   Tobacco Use  . Smoking status: Never Smoker  . Smokeless tobacco: Former Network engineer Use Topics  . Alcohol use: No    Frequency: Never     EXAMINATION:  Vital signs in last 24 hours:    There were no vitals taken for this visit.  General Appearance:    Alert, cooperative, no distress, appears stated age  Head:    Normocephalic, without obvious abnormality, atraumatic  Eyes:    PERRL, conjunctiva/corneas clear, EOM's intact, fundi    benign, both eyes  Ears:    Normal TM's and external ear canals, both ears  Nose:   Nares normal, septum midline, mucosa normal, no drainage    or sinus tenderness  Throat:   Lips, mucosa,  and tongue normal; teeth and gums normal  Neck:   Supple, symmetrical, trachea midline, no adenopathy;    thyroid:  no enlargement/tenderness/nodules; no carotid   bruit or JVD  Back:     Symmetric, no curvature, ROM normal, no CVA tenderness  Lungs:     Clear to auscultation bilaterally, respirations unlabored  Chest Wall:    No tenderness or deformity   Heart:    Regular rate and rhythm, S1 and S2 normal, no murmur, rub   or gallop  Breast Exam:    No tenderness,  masses, or nipple abnormality  Abdomen:     Soft, non-tender, bowel sounds active all four quadrants,    no masses, no organomegaly  Genitalia:    Normal male without lesion, discharge or tenderness  Rectal:    Normal tone, normal prostate, no masses or tenderness;   guaiac negative stool  Extremities:   Extremities normal, atraumatic, no cyanosis or edema  Pulses:   2+ and symmetric all extremities  Skin:   Skin color, texture, turgor normal, no rashes or lesions  Lymph nodes:   Cervical, supraclavicular, and axillary nodes normal  Neurologic:   CNII-XII intact, normal strength, sensation and reflexes    throughout    Musculoskeletal:  ROM 0-120, Ligaments intact,  Imaging Review Plain radiographs demonstrate severe degenerative joint disease of the left knee. The overall alignment is neutral. The bone quality appears to be good for age and reported activity level.  Assessment/Plan: Primary osteoarthritis, left knee   The patient history, physical examination and imaging studies are consistent with advanced degenerative joint disease of the left knee. The patient has failed conservative treatment.  The clearance notes were reviewed.  After discussion with the patient it was felt that Total Knee Replacement was indicated. The procedure,  risks, and benefits of total knee arthroplasty were presented and reviewed. The risks including but not limited to aseptic loosening, infection, blood clots, vascular injury, stiffness, patella tracking problems complications among others were discussed. The patient acknowledged the explanation, agreed to proceed with the plan.  Preoperative templating of the joint replacement has been completed, documented, and submitted to the Operating Room personnel in order to optimize intra-operative equipment management.    Patient's anticipated LOS is less than 2 midnights, meeting these requirements: - Lives within 1 hour of care - Has a competent adult at home  to recover with post-op recover - NO history of  - Chronic pain requiring opiods  - Diabetes  - Coronary Artery Disease  - Heart failure  - Heart attack  - Stroke  - DVT/VTE  - Cardiac arrhythmia  - Respiratory Failure/COPD  - Renal failure  - Anemia  - Advanced Liver disease       Donia Ast 06/29/2018, 6:30 AM

## 2018-06-29 NOTE — Anesthesia Procedure Notes (Signed)
Anesthesia Regional Block: Adductor canal block   Pre-Anesthetic Checklist: ,, timeout performed, Correct Patient, Correct Site, Correct Laterality, Correct Procedure, Correct Position, site marked, Risks and benefits discussed,  Surgical consent,  Pre-op evaluation,  At surgeon's request and post-op pain management  Laterality: Left and Lower  Prep: chloraprep       Needles:  Injection technique: Single-shot  Needle Type: Echogenic Needle     Needle Length: 9cm  Needle Gauge: 21     Additional Needles:   Procedures:,,,, ultrasound used (permanent image in chart),,,,  Narrative:  Start time: 06/29/2018 8:25 AM End time: 06/29/2018 8:31 AM Injection made incrementally with aspirations every 5 mL.  Performed by: Personally  Anesthesiologist: Annye Asa, MD  Additional Notes: Pt identified in Holding room.  Monitors applied. Working IV access confirmed. Sterile prep, drape L thigh.  #21ga ECHOgenic needle into adductor canal with US guidance.  20cc 0.75% Ropivacaine injected incrementally after negative test dose.  Patient asymptomatic, VSS, no heme aspirated, tolerated well.  Jenita Seashore, MD

## 2018-06-29 NOTE — Anesthesia Postprocedure Evaluation (Signed)
Anesthesia Post Note  Patient: Onnie Hatchel [facility]  Procedure(s) Performed: TOTAL KNEE ARTHROPLASTY (Left Knee)     Patient location during evaluation: PACU Anesthesia Type: General Level of consciousness: awake and alert, patient cooperative and oriented Pain management: pain level controlled Vital Signs Assessment: post-procedure vital signs reviewed and stable Respiratory status: spontaneous breathing, nonlabored ventilation and respiratory function stable Cardiovascular status: blood pressure returned to baseline and stable Postop Assessment: no apparent nausea or vomiting Anesthetic complications: no    Last Vitals:  Vitals:   06/29/18 1324 06/29/18 1426  BP: (!) 130/95 124/88  Pulse: 85 74  Resp: 16 16  Temp: 36.5 C   SpO2: 100% 100%    Last Pain:  Vitals:   06/29/18 1324  TempSrc: Oral  PainSc:                  Yossi Hinchman,E. Chava Dulac

## 2018-06-29 NOTE — Evaluation (Signed)
Physical Therapy Evaluation Patient Details Name: Lee Strong MRN: [id number] DOB: [birth year] Today's Date: 06/29/2018   History of Present Illness  78 yo male s/p L TKR on 06/29/18. PMH includes afib, heart disease, heart failure, HLD, DOE, weakness, HTN, prostate cancer.   Clinical Impression   Pt presents with L knee pain, decreased L knee ROM, difficulty performing mobility tasks, increased time and effort to perform bed mobility/gait, and decreased activity tolerance post-surgically. Pt to benefit from acute PT to address deficits. Pt ambulated hallway distance with RW with min guard to min assist for steadying, verbal cuing for safety and form provided throughout. Pt educated on ankle pumps (20/hour) to perform this afternoon/evening to increase circulation, to pt's tolerance and limited by pain. PT to progress mobility as tolerated, and will continue to follow acutely.        Follow Up Recommendations Follow surgeon's recommendation for DC plan and follow-up therapies;Supervision for mobility/OOB(HHPT )    Equipment Recommendations  Rolling walker with 5" wheels    Recommendations for Other Services       Precautions / Restrictions Precautions Precautions: Fall Restrictions Weight Bearing Restrictions: No Other Position/Activity Restrictions: WBAT       Mobility  Bed Mobility Overal bed mobility: Needs Assistance Bed Mobility: Supine to Sit;Sit to Supine     Supine to sit: Min guard;HOB elevated Sit to supine: Min assist;HOB elevated   General bed mobility comments: Min guard for supine to sit for safety, increased time to scoot to EOB. Min assist for sit to supine for lifting LEs into bed, positioning. HOB lowered once returned to bed to allow for pt to scoot towards [place].   Transfers Overall transfer level: Needs assistance Equipment used: Rolling walker (2 wheeled) Transfers: Sit to/from Stand Sit to Stand: Min guard;From elevated surface          General transfer comment: Min guard for safety, verbal cuing for hand placement when rising. Pt with no L knee instability/buckling noted when weight shifting.   Ambulation/Gait Ambulation/Gait assistance: Min guard;Min assist Gait Distance (Feet): 50 Feet Assistive device: Rolling walker (2 wheeled) Gait Pattern/deviations: Step-to pattern;Decreased stance time - left;Decreased weight shift to left;Antalgic;Trunk flexed Gait velocity: decr    General Gait Details: Min guard for safety, occasional min assist for steadying. Pt with L knee buckling when backing up to return to bed, self-recovered, otherwise pt with no L knee instability during ambulation. Pt states "my leg feels weaker" when asked during ambulation. Verbal cuing for placement in RW, turning, sequencing with LLE leading.    Stairs            Wheelchair Mobility    Modified Rankin (Stroke Patients Only)       Balance Overall balance assessment: Mild deficits observed, not formally tested                                           Pertinent Vitals/Pain Pain Assessment: 0-10 Pain Score: 1  Pain Location: L knee  Pain Descriptors / Indicators: Discomfort Pain Intervention(s): Limited activity within patient's tolerance;Repositioned;Monitored during session;Ice applied;Premedicated before session    [place] expects to be discharged to:: Private residence Living Arrangements: Spouse/significant other Available Help at Discharge: Available 24 hours/day;Family Type of Home: House Home Access: Level entry     Home Layout: One [place] - single point;Bedside commode  Prior Function Level of Independence: Independent         Comments: Pt reports being very active with yardwork and hobbies PTA      Hand Dominance   Dominant Hand: Right    Extremity/Trunk Assessment   Upper Extremity Assessment Upper Extremity Assessment: Overall WFL for  tasks assessed    Lower Extremity Assessment Lower Extremity Assessment: Overall WFL for tasks assessed;LLE deficits/detail LLE Deficits / Details: suspected post-surgical weakness; able to perform ankle pumps, quad set, SLR without lift assist, heel slides LLE Sensation: WNL    Cervical / Trunk Assessment Cervical / Trunk Assessment: Normal  Communication   Communication: No difficulties;HOH  Cognition Arousal/Alertness: Awake/alert Behavior During Therapy: WFL for tasks assessed/performed Overall Cognitive Status: Within Functional Limits for tasks assessed                                        General Comments      Exercises Total Joint Exercises Goniometric ROM: knee aarom ~10-90*, limited by pain    Assessment/Plan    PT Assessment Patient needs continued PT services  PT Problem List Decreased strength;Pain;Decreased range of motion;Decreased activity tolerance;Decreased knowledge of use of DME;Decreased balance;Decreased mobility       PT Treatment Interventions DME instruction;Therapeutic activities;Gait training;Therapeutic exercise;Patient/family education;Balance training;Functional mobility training    PT Goals (Current goals can be found in the Care Plan section)  Acute Rehab PT Goals Patient Stated Goal: return to PLOF  PT Goal Formulation: With patient Time For Goal Achievement: 07/06/18 Potential to Achieve Goals: Good    Frequency 7X/week   Barriers to discharge        Co-evaluation               AM-PAC PT "6 Clicks" Mobility  Outcome Measure Help needed turning from your back to your side while in a flat bed without using bedrails?: A Little Help needed moving from lying on your back to sitting on the side of a flat bed without using bedrails?: A Little Help needed moving to and from a bed to a chair (including a wheelchair)?: A Little Help needed standing up from a chair using your arms (e.g., wheelchair or bedside  chair)?: A Little Help needed to walk in hospital room?: A Little Help needed climbing 3-5 steps with a railing? : A Little 6 Click Score: 18    End of Session Equipment Utilized During Treatment: Gait belt Activity Tolerance: Patient tolerated treatment well;Patient limited by pain Patient left: in bed;with bed alarm set;with call bell/phone within reach;with family/visitor present;with SCD's reapplied Nurse Communication: Mobility status PT Visit Diagnosis: Other abnormalities of gait and mobility (R26.89);Difficulty in walking, not elsewhere classified (R26.2)    Time: 1610-9604 PT Time Calculation (min) (ACUTE ONLY): 32 min   Charges:   PT Evaluation $PT Eval Low Complexity: 1 Low PT Treatments $Gait Training: 8-22 mins       Julien Girt, PT Acute Rehabilitation Services Pager [phone]  Office [phone]  Roxine Caddy D Elonda Husky 06/29/2018, 4:54 PM

## 2018-06-29 NOTE — Op Note (Signed)
TOTAL KNEE REPLACEMENT OPERATIVE NOTE:  06/29/2018  11:08 AM  PATIENT:  Lee Strong  78 y.o. male  PRE-OPERATIVE DIAGNOSIS:  LT. KNEE OSTEOARTHRITIS  POST-OPERATIVE DIAGNOSIS:  LT. KNEE OSTEOARTHRITIS  PROCEDURE:  Procedure(s): TOTAL KNEE ARTHROPLASTY  SURGEON:  Surgeon(s): Vickey Huger, MD  PHYSICIAN ASSISTANT: Carlyon Shadow, PA-C   ANESTHESIA:   general  SPECIMEN: None  COUNTS:  Correct  TOURNIQUET:   Total Tourniquet Time Documented: Thigh (Left) - 35 minutes Total: Thigh (Left) - 35 minutes   DICTATION:  Indication for procedure:    The patient is a 78 y.o. male who has failed conservative treatment for LT. KNEE OSTEOARTHRITIS.  Informed consent was obtained prior to anesthesia. The risks versus benefits of the operation were explain and in a way the patient can, and did, understand.   Description of procedure:     The patient was taken to the operating room and placed under anesthesia.  The patient was positioned in the usual fashion taking care that all body parts were adequately padded and/or protected.  A tourniquet was applied and the leg prepped and draped in the usual sterile fashion.  The extremity was exsanguinated with the esmarch and tourniquet inflated to 250 mmHg.  Pre-operative range of motion was normal.   A midline incision approximately 6-7 inches long was made with a #10 blade.  A new blade was used to make a parapatellar arthrotomy going 2-3 cm into the quadriceps tendon, over the patella, and alongside the medial aspect of the patellar tendon.  A synovectomy was then performed with the #10 blade and forceps. I then elevated the deep MCL off the medial tibial metaphysis subperiosteally around to the semimembranosus attachment.    I everted the patella and used calipers to measure patellar thickness.  I used the reamer to ream down to appropriate thickness to recreate the native thickness.  I then removed excess bone with the rongeur and sagittal  saw.  I used the appropriately sized template and drilled the three lug holes.  I then put the trial in place and measured the thickness with the calipers to ensure recreation of the native thickness.  The trial was then removed and the patella subluxed and the knee brought into flexion.  A homan retractor was place to retract and protect the patella and lateral structures.  A Z-retractor was place medially to protect the medial structures.  The extra-medullary alignment system was used to make cut the tibial articular surface perpendicular to the anamotic axis of the tibia and in 3 degrees of posterior slope.  The cut surface and alignment jig was removed.  I then used the intramedullary alignment guide to make a valgus cut on the distal femur.  I then marked out the epicondylar axis on the distal femur.   I then used the anterior referencing sizer and measured the femur to be a size 10.  The 4-In-1 cutting block was screwed into place in external rotation matching the posterior condylar angle, making our cuts perpendicular to the epicondylar axis.  Anterior, posterior and chamfer cuts were made with the sagittal saw.  The cutting block and cut pieces were removed.  A lamina spreader was placed in 90 degrees of flexion.  The ACL, PCL, menisci, and posterior condylar osteophytes were removed.  A 10 mm spacer blocked was found to offer good flexion and extension gap balance after minimal in degree releasing.   The scoop retractor was then placed and the femoral finishing block was pinned  in place.  The small sagittal saw was used as well as the lug drill to finish the femur.  The block and cut surfaces were removed and the medullary canal hole filled with autograft bone from the cut pieces.  The tibia was delivered forward in deep flexion and external rotation.  A size G tray was selected and pinned into place centered on the medial 1/3 of the tibial tubercle.  The reamer and keel was used to prepare the  tibia through the tray.    I then trialed with the size 10 femur, size G tibia, a 11 mm insert and the 35 patella.  I had excellent flexion/extension gap balance, excellent patella tracking.  Flexion was full and beyond 120 degrees; extension was zero.  These components were chosen and the staff opened them to me on the back table while the knee was lavaged copiously and the cement mixed.  The soft tissue was infiltrated with 60cc of exparel 1.3% through a 21 gauge needle.  I cemented in the components and removed all excess cement.  The polyethylene tibial component was snapped into place and the knee placed in extension while cement was hardening.  The capsule was infilltrated with a 60cc exparel/marcaine/saline mixture.   Once the cement was hard, the tourniquet was let down.  Hemostasis was obtained.  The arthrotomy was closed using a #1 stratofix running suture.  The deep soft tissues were closed with #0 vicryls and the subcuticular layer closed with #2-0 vicryl.  The skin was reapproximated and closed with 3.0 Monocryl.  The wound was covered with steristrips, aquacel dressing, and a TED stocking.   The patient was then awakened, extubated, and taken to the recovery room in stable condition.  BLOOD LOSS:  492EF COMPLICATIONS:  None.  PLAN OF CARE: Admit for overnight observation  PATIENT DISPOSITION:  PACU - hemodynamically stable.    Please fax a copy of this op note to my office at [phone] (please only include page 1 and 2 of the Case Information op note)

## 2018-06-30 ENCOUNTER — Encounter (HOSPITAL_COMMUNITY): Payer: Self-pay | Admitting: Orthopedic Surgery

## 2018-06-30 DIAGNOSIS — M1712 Unilateral primary osteoarthritis, left knee: Secondary | ICD-10-CM | POA: Diagnosis not present

## 2018-06-30 LAB — PROTIME-INR
INR: 1.1 (ref 0.8–1.2)
Prothrombin Time: 14.3 seconds (ref 11.4–15.2)

## 2018-06-30 LAB — BASIC METABOLIC PANEL
Anion gap: 4 — ABNORMAL LOW (ref 5–15)
BUN: 25 mg/dL — ABNORMAL HIGH (ref 8–23)
CO2: 25 mmol/L (ref 22–32)
Calcium: 8.2 mg/dL — ABNORMAL LOW (ref 8.9–10.3)
Chloride: 107 mmol/L (ref 98–111)
Creatinine, Ser: 1 mg/dL (ref 0.61–1.24)
GFR calc Af Amer: >60 mL/min (ref >60)
GFR calc non Af Amer: >60 mL/min (ref >60)
Glucose, Bld: 123 mg/dL — ABNORMAL HIGH (ref 70–99)
Potassium: 4.2 mmol/L (ref 3.5–5.1)
Sodium: 136 mmol/L (ref 135–145)

## 2018-06-30 LAB — CBC
HCT: 30.6 % — ABNORMAL LOW (ref 39.0–52.0)
Hemoglobin: 9.5 g/dL — ABNORMAL LOW (ref 13.0–17.0)
MCH: 31 pg (ref 26.0–34.0)
MCHC: 31 g/dL (ref 30.0–36.0)
MCV: 100 fL (ref 80.0–100.0)
NRBC: 0 % (ref 0.0–0.2)
Platelets: 158 10*3/uL (ref 150–400)
RBC: 3.06 MIL/uL — ABNORMAL LOW (ref 4.22–5.81)
RDW: 14.6 % (ref 11.5–15.5)
WBC: 11.3 10*3/uL — ABNORMAL HIGH (ref 4.0–10.5)

## 2018-06-30 MED ORDER — METHOCARBAMOL 500 MG PO TABS: 500.0000 mg | ORAL_TABLET | Freq: Four times a day (QID) | ORAL | 0 refills | Status: DC | PRN

## 2018-06-30 MED ORDER — ENOXAPARIN SODIUM 40 MG/0.4ML ~~LOC~~ SOLN: 40.0000 mg | SUBCUTANEOUS | 0 refills | Status: DC

## 2018-06-30 MED ORDER — OXYCODONE HCL 5 MG PO TABS: 5.0000 mg | ORAL_TABLET | Freq: Four times a day (QID) | ORAL | 0 refills | Status: DC | PRN

## 2018-06-30 NOTE — Progress Notes (Signed)
Physical Therapy Treatment Patient Details Name: Lee Strong MRN: [id number] DOB: 1941/02/03 Today's Date: 06/30/2018    History of Present Illness 78 yo male s/p L TKR on 06/29/18. PMH includes afib, heart disease, heart failure, HLD, DOE, weakness, HTN, prostate cancer.     PT Comments    POD # 1pm session Assisted OOB to amb a greater distance.  General Gait Details: 25% VC's on proper walker to self distance and to decrease gait speed to increased safety/balance.  Returned to room and performed remeaining TE's following HEP handout.  Addressed all mobility questions, discussed appropriate activity, educated on use of ICE.  Pt ready for D/C to home.   Follow Up Recommendations  Follow surgeon's recommendation for DC plan and follow-up therapies;Supervision for mobility/OOB(HH PT)     Equipment Recommendations  Rolling walker with 5" wheels    Recommendations for Other Services       Precautions / Restrictions Precautions Precautions: Fall Restrictions Weight Bearing Restrictions: No Other Position/Activity Restrictions: WBAT     Mobility  Bed Mobility Overal bed mobility: Needs Assistance Bed Mobility: Supine to Sit;Sit to Supine     Supine to sit: Supervision;Min guard Sit to supine: Supervision;Min guard   General bed mobility comments: demonstared and instructed how to use a belt to self assist LE on/off bed   Transfers Overall transfer level: Needs assistance Equipment used: Rolling walker (2 wheeled) Transfers: Sit to/from [facility] Sit to Stand: Supervision;Min guard Stand pivot transfers: Supervision;Min guard       General transfer comment: 50% VC's on proper hand placement and safety with turns  Ambulation/Gait Ambulation/Gait assistance: Min guard;Min assist Gait Distance (Feet): 74 Feet Assistive device: Rolling walker (2 wheeled) Gait Pattern/deviations: Step-to pattern;Decreased stance time - left;Decreased weight shift  to left;Antalgic;Trunk flexed     General Gait Details: 25% VC's on proper walker to self distance and to decrease gait speed to increased safety/balance.     Stairs Stairs: (no stairs to enter home)           Wheelchair Mobility    Modified Rankin (Stroke Patients Only)       Balance                                            Cognition Arousal/Alertness: Awake/alert Behavior During Therapy: WFL for tasks assessed/performed Overall Cognitive Status: Within Functional Limits for tasks assessed                                        Exercises      General Comments        Pertinent Vitals/Pain Pain Assessment: 0-10 Pain Score: 3  Pain Location: L knee  Pain Descriptors / Indicators: Discomfort;Tender Pain Intervention(s): Monitored during session;Repositioned;Premedicated before session;Ice applied    Home Living                      Prior Function            PT Goals (current goals can now be found in the care plan section) Progress towards PT goals: Progressing toward goals    Frequency    7X/week      PT Plan Current plan remains appropriate    Co-evaluation  AM-PAC PT "6 Clicks" Mobility   Outcome Measure  Help needed turning from your back to your side while in a flat bed without using bedrails?: A Little Help needed moving from lying on your back to sitting on the side of a flat bed without using bedrails?: A Little Help needed moving to and from a bed to a chair (including a wheelchair)?: A Little Help needed standing up from a chair using your arms (e.g., wheelchair or bedside chair)?: A Little Help needed to walk in hospital room?: A Little Help needed climbing 3-5 steps with a railing? : A Little 6 Click Score: 18    End of Session Equipment Utilized During Treatment: Gait belt Activity Tolerance: Patient tolerated treatment well Patient left: in chair;with call  bell/phone within reach Nurse Communication: Mobility status PT Visit Diagnosis: Other abnormalities of gait and mobility (R26.89);Difficulty in walking, not elsewhere classified (R26.2)     Time: 9373-4287 PT Time Calculation (min) (ACUTE ONLY): 25 min  Charges:  $Gait Training: 8-22 mins $Self Care/Home Management: [place]  PTA Acute  Rehabilitation Services Pager      [phone] Office      [phone]

## 2018-06-30 NOTE — Care Management Note (Signed)
Case Management Note  Patient Details  Name: Lee Strong MRN: [id number] Date of Birth: [birth year]  Subjective/Objective:                  discharged  Action/Plan: [facility] with [facility] for [facility]  Expected Discharge Date:  06/30/18               Expected Discharge Plan:  [facility]  In-House Referral:     Discharge planning Services  CM Consult  Post Acute Care Choice:  Home Health Choice offered to:     DME Arranged:    DME Agency:     HH Arranged:    [place] Agency:     Status of Service:  Completed, signed off  If discussed at [employer], dates discussed:    Additional Comments:  Leeroy Cha, RN 06/30/2018, 2:57 PM

## 2018-06-30 NOTE — Progress Notes (Signed)
Physical Therapy Treatment Patient Details Name: Lee Strong MRN: [id number] DOB: 1940-07-13 Today's Date: 06/30/2018    History of Present Illness 78 yo male s/p L TKR on 06/29/18. PMH includes afib, heart disease, heart failure, HLD, DOE, weakness, HTN, prostate cancer.     PT Comments    POD # 1 am session Assisted OOB to amb in hallway.  General transfer comment: 50% VC's on proper hand placement and safety with turns.  Gait Distance (Feet): 55 Feet Assistive device: Rolling walker (2 wheeled) Gait Pattern/deviations: Step-to pattern;Decreased stance time - left;Decreased weight shift to left;Antalgic;Trunk flexed.  General Gait Details: 50% VC's on proper walker to self distance and to decrease gait speed to increased safety/balance.  Then returned to room to perform some TE's following HEP handout.  Instructed on proper tech, freq as well as use of ICE.      Follow Up Recommendations  Follow surgeon's recommendation for DC plan and follow-up therapies;Supervision for mobility/OOB(HH PT)     Equipment Recommendations  Rolling walker with 5" wheels    Recommendations for Other Services       Precautions / Restrictions Precautions Precautions: Fall Restrictions Weight Bearing Restrictions: No Other Position/Activity Restrictions: WBAT     Mobility  Bed Mobility Overal bed mobility: Needs Assistance Bed Mobility: Supine to Sit;Sit to Supine     Supine to sit: Supervision;Min guard Sit to supine: Supervision;Min guard   General bed mobility comments: demonstared and instructed how to use a belt to self assist LE on/off bed   Transfers Overall transfer level: Needs assistance Equipment used: Rolling walker (2 wheeled) Transfers: Sit to/from [facility] Sit to Stand: Supervision;Min guard Stand pivot transfers: Supervision;Min guard       General transfer comment: 50% VC's on proper hand placement and safety with  turns  Ambulation/Gait Ambulation/Gait assistance: Min guard;Min assist Gait Distance (Feet): 55 Feet Assistive device: Rolling walker (2 wheeled) Gait Pattern/deviations: Step-to pattern;Decreased stance time - left;Decreased weight shift to left;Antalgic;Trunk flexed     General Gait Details: 50% VC's on proper walker to self distance and to decrease gait speed to increased safety/balance.     Stairs             Wheelchair Mobility    Modified Rankin (Stroke Patients Only)       Balance                                            Cognition Arousal/Alertness: Awake/alert Behavior During Therapy: WFL for tasks assessed/performed Overall Cognitive Status: Within Functional Limits for tasks assessed                                        Exercises   Total Knee Replacement TE's 10 reps B LE ankle pumps 10 reps towel squeezes 10 reps knee presses 10 reps heel slides  10 reps SAQ's 10 reps SLR's 10 reps ABD Followed by ICE    General Comments        Pertinent Vitals/Pain Pain Assessment: 0-10 Pain Score: 3  Pain Location: L knee  Pain Descriptors / Indicators: Discomfort;Tender Pain Intervention(s): Monitored during session;Repositioned;Premedicated before session;Ice applied    Home Living  Prior Function            PT Goals (current goals can now be found in the care plan section) Progress towards PT goals: Progressing toward goals    Frequency    7X/week      PT Plan Current plan remains appropriate    Co-evaluation              AM-PAC PT "6 Clicks" Mobility   Outcome Measure  Help needed turning from your back to your side while in a flat bed without using bedrails?: A Little Help needed moving from lying on your back to sitting on the side of a flat bed without using bedrails?: A Little Help needed moving to and from a bed to a chair (including a wheelchair)?: A  Little Help needed standing up from a chair using your arms (e.g., wheelchair or bedside chair)?: A Little Help needed to walk in hospital room?: A Little Help needed climbing 3-5 steps with a railing? : A Little 6 Click Score: 18    End of Session Equipment Utilized During Treatment: Gait belt Activity Tolerance: Patient tolerated treatment well Patient left: in chair;with call bell/phone within reach Nurse Communication: Mobility status PT Visit Diagnosis: Other abnormalities of gait and mobility (R26.89);Difficulty in walking, not elsewhere classified (R26.2)     Time: 1586-8257 PT Time Calculation (min) (ACUTE ONLY): 24 min  Charges:  $Gait Training: 8-22 mins $Therapeutic Exercise: 8-22 mins                     Rica Koyanagi  PTA Acute  Rehabilitation Services Pager      [phone] Office      [phone]

## 2018-06-30 NOTE — Discharge Summary (Signed)
SPORTS MEDICINE & JOINT REPLACEMENT   Lara Mulch, MD   Carlyon Shadow, PA-C [place], [place], [place]  [address]                             [phone]  PATIENT ID: Lee Strong        MRN:  [id number]          DOB/AGE: 1940-10-20 / 78 y.o.    DISCHARGE SUMMARY  ADMISSION DATE:    06/29/2018 DISCHARGE DATE:   06/30/2018   ADMISSION DIAGNOSIS: LT. KNEE OSTEOARTHRITIS    DISCHARGE DIAGNOSIS:  LT. KNEE OSTEOARTHRITIS    ADDITIONAL DIAGNOSIS: Active Problems:   S/P total knee replacement  Past Medical History:  Diagnosis Date  . Atrial fibrillation ([place]) 06/17/2017  . Chronic atrial fibrillation 12/20/2014   Overview:  .CHADS2 vasc score=2  . Complication of anesthesia   . Essential hypertension 12/20/2014  . Hyperlipidemia 06/17/2017  . Hypertension 06/17/2017  . Long term (current) use of anticoagulants 12/20/2014  . PONV (postoperative nausea and vomiting)   . Prostate cancer ([place])   . Short of breath on exertion 06/17/2017  . Weakness 06/17/2017    PROCEDURE: Procedure(s): TOTAL KNEE ARTHROPLASTY on 06/29/2018  CONSULTS:    HISTORY:  See H&P in chart  HOSPITAL COURSE:  Lee Strong is a 78 y.o. admitted on 06/29/2018 and found to have a diagnosis of LT. KNEE OSTEOARTHRITIS.  After appropriate laboratory studies were obtained  they were taken to the operating room on 06/29/2018 and underwent Procedure(s): TOTAL KNEE ARTHROPLASTY.   They were given perioperative antibiotics:  Anti-infectives (From admission, onward)   Start     Dose/Rate Route Frequency Ordered Stop   06/29/18 1500  ceFAZolin (ANCEF) IVPB 2g/100 mL premix     2 g 200 mL/hr over 30 Minutes Intravenous Every 6 hours 06/29/18 1152 06/29/18 2158   06/29/18 0700  ceFAZolin (ANCEF) IVPB 2g/100 mL premix     2 g 200 mL/hr over 30 Minutes Intravenous On call to O.R. 06/29/18 6967 06/29/18 0934    .  Patient given tranexamic acid IV or topical and exparel intra-operatively.  Tolerated  the procedure well.    POD# 1: Vital signs were stable.  Patient denied Chest pain, shortness of breath, or calf pain.  Patient was started on Aspirin twice daily at 8am.  Consults to PT, OT, and care management were made.  The patient was weight bearing as tolerated.  CPM was placed on the operative leg 0-90 degrees for 6-8 hours a day. When out of the CPM, patient was placed in the foam block to achieve full extension. Incentive spirometry was taught.  Dressing was changed.       POD #2, Continued  PT for ambulation and exercise program.  IV saline locked.  O2 discontinued.    The remainder of the hospital course was dedicated to ambulation and strengthening.   The patient was discharged on 1 Day Post-Op in  Good condition.  Blood products given:none  DIAGNOSTIC STUDIES: Recent vital signs:  Patient Vitals for the past 24 hrs:  BP Temp Temp src Pulse Resp SpO2 Height Weight  06/30/18 0513 (!) 91/58 97.8 F (36.6 C) Oral 89 18 97 % - -  06/30/18 0135 95/65 97.7 F (36.5 C) Oral 70 14 97 % - -  06/29/18 2241 96/64 (!) 97.4 F (36.3 C) Oral 89 18 100 % - -  06/29/18 1811  109/70 97.8 F (36.6 C) Oral 88 16 100 % - -  06/29/18 1535 112/71 - - 81 - 100 % - -  06/29/18 1426 124/88 - - 74 16 100 % - -  06/29/18 1324 (!) 130/95 97.7 F (36.5 C) Oral 85 16 100 % - -  06/29/18 1145 131/80 98.2 F (36.8 C) - 80 17 100 % - -  06/29/18 1130 123/79 - - 86 16 - - -  06/29/18 1115 (!) 123/97 - - (!) 102 18 100 % - -  06/29/18 1100 130/86 - - 95 12 100 % - -  06/29/18 1055 (!) 131/93 98.8 F (37.1 C) - 93 16 100 % - -  06/29/18 0855 111/77 - - 83 18 100 % - -  06/29/18 0850 103/75 - - 82 (!) 21 100 % - -  06/29/18 0845 99/73 - - 88 19 100 % - -  06/29/18 0840 105/73 - - 81 17 100 % - -  06/29/18 0833 - - - 88 18 100 % - -  06/29/18 0832 - - - 83 20 100 % - -  06/29/18 0831 - - - 65 17 100 % - -  06/29/18 0830 116/73 - - 80 19 100 % - -  06/29/18 0829 - - - 76 17 100 % - -  06/29/18 0828  - - - 70 16 100 % - -  06/29/18 0827 - - - 86 15 100 % - -  06/29/18 0826 - - - 73 17 100 % - -  06/29/18 0825 117/70 - - 79 14 100 % - -  06/29/18 0824 - - - 82 15 98 % - -  06/29/18 0823 - - - - 14 - - -  06/29/18 0865 - - - - 14 - - -  06/29/18 0723 - - - - - - 6\' 2"  (1.88 m) 75.3 kg       Recent laboratory studies: Recent Labs    06/24/18 1205 06/29/18 1209 06/30/18 0341  WBC 6.8 5.5 11.3*  HGB 12.8* 11.6* 9.5*  HCT 40.4 36.0* 30.6*  PLT 179 145* 158   Recent Labs    06/24/18 1205 06/29/18 1209 06/30/18 0341  NA 137  --  136  K 4.1  --  4.2  CL 104  --  107  CO2 27  --  25  BUN 22  --  25*  CREATININE 0.96 1.10 1.00  GLUCOSE 97  --  123*  CALCIUM 8.9  --  8.2*   Lab Results  Component Value Date   INR 1.1 06/30/2018   INR 3.5 (H) 07/24/2017   INR 1.1 11/19/2006     Recent Radiographic Studies :  No results found.  DISCHARGE INSTRUCTIONS: Discharge Instructions    Call MD / Call 911   Complete by:  As directed    If you experience chest pain or shortness of breath, CALL 911 and be transported to the hospital emergency room.  If you develope a fever above 101 F, pus (white drainage) or increased drainage or redness at the wound, or calf pain, call your surgeon's office.   Constipation Prevention   Complete by:  As directed    Drink plenty of fluids.  Prune juice may be helpful.  You may use a stool softener, such as Colace (over the counter) 100 mg twice a day.  Use MiraLax (over the counter) for constipation as needed.   Diet - low sodium heart healthy  Complete by:  As directed    Discharge instructions   Complete by:  As directed    INSTRUCTIONS AFTER JOINT REPLACEMENT   Remove items at home which could result in a fall. This includes throw rugs or furniture in walking pathways ICE to the affected joint every three hours while awake for 30 minutes at a time, for at least the first 3-5 days, and then as needed for pain and swelling.  Continue to use ice  for pain and swelling. You may notice swelling that will progress down to the foot and ankle.  This is normal after surgery.  Elevate your leg when you are not up walking on it.   Continue to use the breathing machine you got in the hospital (incentive spirometer) which will help keep your temperature down.  It is common for your temperature to cycle up and down following surgery, especially at night when you are not up moving around and exerting yourself.  The breathing machine keeps your lungs expanded and your temperature down.   DIET:  As you were doing prior to hospitalization, we recommend a well-balanced diet.  DRESSING / WOUND CARE / SHOWERING  Keep the surgical dressing until follow up.  The dressing is water proof, so you can shower without any extra covering.  IF THE DRESSING FALLS OFF or the wound gets wet inside, change the dressing with sterile gauze.  Please use good hand washing techniques before changing the dressing.  Do not use any lotions or creams on the incision until instructed by your surgeon.    ACTIVITY  Increase activity slowly as tolerated, but follow the weight bearing instructions below.   No driving for 6 weeks or until further direction given by your physician.  You cannot drive while taking narcotics.  No lifting or carrying greater than 10 lbs. until further directed by your surgeon. Avoid periods of inactivity such as sitting longer than an hour when not asleep. This helps prevent blood clots.  You may return to work once you are authorized by your doctor.     WEIGHT BEARING   Weight bearing as tolerated with assist device (walker, cane, etc) as directed, use it as long as suggested by your surgeon or therapist, typically at least 4-6 weeks.   EXERCISES  Results after joint replacement surgery are often greatly improved when you follow the exercise, range of motion and muscle strengthening exercises prescribed by your doctor. Safety measures are also  important to protect the joint from further injury. Any time any of these exercises cause you to have increased pain or swelling, decrease what you are doing until you are comfortable again and then slowly increase them. If you have problems or questions, call your caregiver or physical therapist for advice.   Rehabilitation is important following a joint replacement. After just a few days of immobilization, the muscles of the leg can become weakened and shrink (atrophy).  These exercises are designed to build up the tone and strength of the thigh and leg muscles and to improve motion. Often times heat used for twenty to thirty minutes before working out will loosen up your tissues and help with improving the range of motion but do not use heat for the first two weeks following surgery (sometimes heat can increase post-operative swelling).   These exercises can be done on a training (exercise) mat, on the floor, on a table or on a bed. Use whatever works the best and is most comfortable for you.  Use music or television while you are exercising so that the exercises are a pleasant break in your day. This will make your life better with the exercises acting as a break in your routine that you can look forward to.   Perform all exercises about fifteen times, three times per day or as directed.  You should exercise both the operative leg and the other leg as well.   Exercises include:   Quad Sets - Tighten up the muscle on the front of the thigh (Quad) and hold for 5-10 seconds.   Straight Leg Raises - With your knee straight (if you were given a brace, keep it on), lift the leg to 60 degrees, hold for 3 seconds, and slowly lower the leg.  Perform this exercise against resistance later as your leg gets stronger.  Leg Slides: Lying on your back, slowly slide your foot toward your buttocks, bending your knee up off the floor (only go as far as is comfortable). Then slowly slide your foot back down until your  leg is flat on the floor again.  Angel Wings: Lying on your back spread your legs to the side as far apart as you can without causing discomfort.  Hamstring Strength:  Lying on your back, push your heel against the floor with your leg straight by tightening up the muscles of your buttocks.  Repeat, but this time bend your knee to a comfortable angle, and push your heel against the floor.  You may put a pillow under the heel to make it more comfortable if necessary.   A rehabilitation program following joint replacement surgery can speed recovery and prevent re-injury in the future due to weakened muscles. Contact your doctor or a physical therapist for more information on knee rehabilitation.    CONSTIPATION  Constipation is defined medically as fewer than three stools per week and severe constipation as less than one stool per week.  Even if you have a regular bowel pattern at home, your normal regimen is likely to be disrupted due to multiple reasons following surgery.  Combination of anesthesia, postoperative narcotics, change in appetite and fluid intake all can affect your bowels.   YOU MUST use at least one of the following options; they are listed in order of increasing strength to get the job done.  They are all available over the counter, and you may need to use some, POSSIBLY even all of these options:    Drink plenty of fluids (prune juice may be helpful) and high fiber foods Colace 100 mg by mouth twice a day  Senokot for constipation as directed and as needed Dulcolax (bisacodyl), take with full glass of water  Miralax (polyethylene glycol) once or twice a day as needed.  If you have tried all these things and are unable to have a bowel movement in the first 3-4 days after surgery call either your surgeon or your primary doctor.    If you experience loose stools or diarrhea, hold the medications until you stool forms back up.  If your symptoms do not get better within 1 week or if  they get worse, check with your doctor.  If you experience "the worst abdominal pain ever" or develop nausea or vomiting, please contact the office immediately for further recommendations for treatment.   ITCHING:  If you experience itching with your medications, try taking only a single pain pill, or even half a pain pill at a time.  You can also use Benadryl over the  counter for itching or also to help with sleep.   TED HOSE STOCKINGS:  Use stockings on both legs until for at least 2 weeks or as directed by physician office. They may be removed at night for sleeping.  MEDICATIONS:  See your medication summary on the "After Visit Summary" that nursing will review with you.  You may have some home medications which will be placed on hold until you complete the course of blood thinner medication.  It is important for you to complete the blood thinner medication as prescribed.  PRECAUTIONS:  If you experience chest pain or shortness of breath - call 911 immediately for transfer to the hospital emergency department.   If you develop a fever greater that 101 F, purulent drainage from wound, increased redness or drainage from wound, foul odor from the wound/dressing, or calf pain - CONTACT YOUR SURGEON.                                                   FOLLOW-UP APPOINTMENTS:  If you do not already have a post-op appointment, please call the office for an appointment to be seen by your surgeon.  Guidelines for how soon to be seen are listed in your "After Visit Summary", but are typically between 1-4 weeks after surgery.  OTHER INSTRUCTIONS:   Knee Replacement:  Do not place pillow under knee, focus on keeping the knee straight while resting. CPM instructions: 0-90 degrees, 2 hours in the morning, 2 hours in the afternoon, and 2 hours in the evening. Place foam block, curve side up under heel at all times except when in CPM or when walking.  DO NOT modify, tear, cut, or change the foam block in any  way.  MAKE SURE YOU:  Understand these instructions.  Get help right away if you are not doing well or get worse.    Thank you for letting us be a part of your medical care team.  It is a privilege we respect greatly.  We hope these instructions will help you stay on track for a fast and full recovery!   Increase activity slowly as tolerated   Complete by:  As directed       DISCHARGE MEDICATIONS:   Allergies as of 06/30/2018      Reactions   Penicillins Swelling, Other (See Comments)   Has patient had a PCN reaction causing immediate rash, facial/tongue/throat swelling, SOB or lightheadedness with hypotension: Unknown Has patient had a PCN reaction causing severe rash involving mucus membranes or skin necrosis: Unknown Has patient had a PCN reaction that required hospitalization: Unknown Has patient had a PCN reaction occurring within the last 10 years: No If all of the above answers are "NO", then may proceed with Cephalosporin use.      Medication List    STOP taking these medications   VITAMIN B-12 PO     TAKE these medications   enoxaparin 40 MG/0.4ML injection Commonly known as:  LOVENOX Inject 0.4 mLs (40 mg total) into the skin daily. What changed:    medication strength  how much to take  when to take this   furosemide 40 MG tablet Commonly known as:  LASIX Take 1 tablet (40 mg total) by mouth daily.   methocarbamol 500 MG tablet Commonly known as:  ROBAXIN Take 1-2 tablets (500-1,000 mg total)  by mouth every 6 (six) hours as needed for muscle spasms.   Metoprolol Succinate 100 MG Cs24 Take 2 tablets by mouth daily. What changed:  how much to take   olmesartan 20 MG tablet Commonly known as:  BENICAR Take 20 mg by mouth daily.   oxyCODONE 5 MG immediate release tablet Commonly known as:  Oxy IR/ROXICODONE Take 1-2 tablets (5-10 mg total) by mouth every 6 (six) hours as needed for moderate pain (pain score 4-6).   spironolactone 25 MG  tablet Commonly known as:  ALDACTONE Take 0.5 tablets (12.5 mg total) by mouth daily.   vitamin C 500 MG tablet Commonly known as:  ASCORBIC ACID Take 500 mg by mouth daily.   VITAMIN E PO Take 1 capsule by mouth daily.   warfarin 3 MG tablet Commonly known as:  COUMADIN Take 3-4 mg by mouth See admin instructions. Take 3 mg by mouth every other day alternating with 4 mg by mouth every other day.            Durable Medical Equipment  (From admission, onward)         Start     Ordered   06/29/18 1152  DME Walker rolling  Once    Question:  Patient needs a walker to treat with the following condition  Answer:  S/P total knee replacement   06/29/18 1152   06/29/18 1152  DME 3 n 1  Once     06/29/18 1152   06/29/18 1152  DME Bedside commode  Once    Question:  Patient needs a bedside commode to treat with the following condition  Answer:  S/P total knee replacement   06/29/18 1152          FOLLOW UP VISIT:    DISPOSITION: HOME VS. SNF  CONDITION:  Good   Donia Ast 06/30/2018, 7:05 AM

## 2018-06-30 NOTE — Progress Notes (Signed)
SPORTS MEDICINE AND JOINT REPLACEMENT  Lara Mulch, MD    Carlyon Shadow, PA-C [place], [place], [place]  [address]                             [phone]   PROGRESS NOTE  Subjective:  negative for Chest Pain  negative for Shortness of Breath  negative for Nausea/Vomiting   negative for Calf Pain  negative for Bowel Movement   Tolerating Diet: yes         Patient reports pain as 3 on 0-10 scale.    Objective: Vital signs in last 24 hours:    Patient Vitals for the past 24 hrs:  BP Temp Temp src Pulse Resp SpO2 Height Weight  06/30/18 0513 (!) 91/58 97.8 F (36.6 C) Oral 89 18 97 % - -  06/30/18 0135 95/65 97.7 F (36.5 C) Oral 70 14 97 % - -  06/29/18 2241 96/64 (!) 97.4 F (36.3 C) Oral 89 18 100 % - -  06/29/18 1811 109/70 97.8 F (36.6 C) Oral 88 16 100 % - -  06/29/18 1535 112/71 - - 81 - 100 % - -  06/29/18 1426 124/88 - - 74 16 100 % - -  06/29/18 1324 (!) 130/95 97.7 F (36.5 C) Oral 85 16 100 % - -  06/29/18 1145 131/80 98.2 F (36.8 C) - 80 17 100 % - -  06/29/18 1130 123/79 - - 86 16 - - -  06/29/18 1115 (!) 123/97 - - (!) 102 18 100 % - -  06/29/18 1100 130/86 - - 95 12 100 % - -  06/29/18 1055 (!) 131/93 98.8 F (37.1 C) - 93 16 100 % - -  06/29/18 0855 111/77 - - 83 18 100 % - -  06/29/18 0850 103/75 - - 82 (!) 21 100 % - -  06/29/18 0845 99/73 - - 88 19 100 % - -  06/29/18 0840 105/73 - - 81 17 100 % - -  06/29/18 0833 - - - 88 18 100 % - -  06/29/18 0832 - - - 83 20 100 % - -  06/29/18 0831 - - - 65 17 100 % - -  06/29/18 0830 116/73 - - 80 19 100 % - -  06/29/18 0829 - - - 76 17 100 % - -  06/29/18 0828 - - - 70 16 100 % - -  06/29/18 0827 - - - 86 15 100 % - -  06/29/18 0826 - - - 73 17 100 % - -  06/29/18 0825 117/70 - - 79 14 100 % - -  06/29/18 0824 - - - 82 15 98 % - -  06/29/18 0823 - - - - 14 - - -  06/29/18 1856 - - - - 14 - - -  06/29/18 0723 - - - - - - 6\' 2"  (1.88 m) 75.3 kg  06/29/18 0704 121/87 97.8 F (36.6 C)  Oral (!) 104 18 100 % - -    @flow {1959:LAST@   Intake/Output from previous day:   02/24 0701 - 02/25 0700 In: 2745.3 [P.O.:600; I.V.:1745.3] Out: 950 [Urine:900]   Intake/Output this shift:   02/24 1901 - 02/25 0700 In: 1060.4 [P.O.:360; I.V.:600.4] Out: 600 [Urine:600]   Intake/Output      02/24 0701 - 02/25 0700   P.O. 600   I.V. (mL/kg) 1745.3 (23.2)   IV Piggyback 400  Total Intake(mL/kg) 2745.3 (36.5)   Urine (mL/kg/hr) 900 (0.5)   Emesis/NG output 0   Stool 0   Blood 50   Total Output 950   Net +1795.3       Urine Occurrence 1 x   Stool Occurrence 0 x   Emesis Occurrence 0 x      LABORATORY DATA: Recent Labs    06/24/18 1205 06/29/18 1209 06/30/18 0341  WBC 6.8 5.5 11.3*  HGB 12.8* 11.6* 9.5*  HCT 40.4 36.0* 30.6*  PLT 179 145* 158   Recent Labs    06/24/18 1205 06/29/18 1209 06/30/18 0341  NA 137  --  136  K 4.1  --  4.2  CL 104  --  107  CO2 27  --  25  BUN 22  --  25*  CREATININE 0.96 1.10 1.00  GLUCOSE 97  --  123*  CALCIUM 8.9  --  8.2*   Lab Results  Component Value Date   INR 1.1 06/30/2018   INR 3.5 (H) 07/24/2017   INR 1.1 11/19/2006    Examination:  General appearance: alert, cooperative and no distress Extremities: extremities normal, atraumatic, no cyanosis or edema  Wound Exam: clean, dry, intact   Drainage:  None: wound tissue dry  Motor Exam: Quadriceps and Hamstrings Intact  Sensory Exam: Superficial Peroneal, Deep Peroneal and Tibial normal   Assessment:    1 Day Post-Op  Procedure(s) (LRB): TOTAL KNEE ARTHROPLASTY (Left)  ADDITIONAL DIAGNOSIS:  Active Problems:   S/P total knee replacement     Plan: Physical Therapy as ordered Weight Bearing as Tolerated (WBAT)  DVT Prophylaxis:  Coumadin  DISCHARGE PLAN: Home  DISCHARGE NEEDS: HHPT     Patient doing well, expected D/C home today  Patient's anticipated LOS is less than 2 midnights, meeting these requirements: - Lives within 1 hour of care -  Has a competent adult at home to recover with post-op recover - NO history of  - Chronic pain requiring opiods  - Diabetes  - Coronary Artery Disease  - Heart failure  - Heart attack  - Stroke  - DVT/VTE  - Cardiac arrhythmia  - Respiratory Failure/COPD  - Renal failure  - Anemia  - Advanced Liver disease        Donia Ast 06/30/2018, 7:00 AM

## 2018-06-30 NOTE — Care Management Obs Status (Signed)
[facility] NOTIFICATION   Patient Details  Name: Lee Strong MRN: [id number] Date of Birth: Apr 09, 1941   Medicare Observation Status Notification Given:  Yes    Leeroy Cha, RN 06/30/2018, 10:55 AM

## 2018-06-30 NOTE — Plan of Care (Signed)
Pt alert and oriented, some new drainage on bandage this am, reinforced.  Plan to d/c today if PT goals met.  RN will monitor.

## 2018-06-30 NOTE — Plan of Care (Signed)
  Problem: Clinical Measurements: Goal: Will remain free from infection Outcome: Progressing   Problem: Clinical Measurements: Goal: Respiratory complications will improve Outcome: Progressing   Problem: Clinical Measurements: Goal: Cardiovascular complication will be avoided Outcome: Progressing   Problem: Activity: Goal: Risk for activity intolerance will decrease Outcome: Progressing   Problem: Coping: Goal: Level of anxiety will decrease Outcome: Progressing

## 2018-06-30 NOTE — Progress Notes (Signed)
Discharge paperwork discussed with pt and wife at the bedside.  They demonstrated understanding.  Pt assisted to the bathroom.  Pt to be escorted to main lobby by wheelchair.

## 2018-07-07 ENCOUNTER — Other Ambulatory Visit: Payer: Self-pay | Admitting: Cardiology

## 2018-07-10 ENCOUNTER — Other Ambulatory Visit: Payer: Self-pay | Admitting: Cardiology

## 2018-07-17 ENCOUNTER — Other Ambulatory Visit: Payer: Self-pay | Admitting: Cardiology

## 2018-09-22 ENCOUNTER — Other Ambulatory Visit: Payer: Self-pay | Admitting: Cardiology

## 2018-10-09 ENCOUNTER — Other Ambulatory Visit: Payer: Self-pay | Admitting: Cardiology

## 2018-10-27 NOTE — Progress Notes (Signed)
Cardiology Office Note:    Date:  10/28/2018   ID:  Lee Strong, DOB 1940-05-15, MRN [id number]  PCP:  Dr Unk Lightning   please do BMP proBNP with his next labs copy to me Cardiologist:  Shirlee More, MD    Referring MD: Cyndy Freeze, MD    ASSESSMENT:    1. Chronic atrial fibrillation   2. Chronic anticoagulation   3. Hypertensive heart disease with heart failure ([place])    PLAN:    In order of problems listed above:  1. Stable rate is controlled with a lower dose of beta-blocker he will continue anticoagulation with warfarin managed by his PCP INR yesterday 2.5 2. Stable continue his current anticoagulant he does not wish to switch to a direct agents 3. Hypertensive heart disease with heart failure stable BP at target no longer takes an ARB he is having some breast tenderness related spironolactone I will switch him to Inspra which accomplished with the same goal without estrogen effect.  If unimproved he will contact me.  Do not think he requires a repeat evaluation of ventricular function at this time   Next appointment: 1 year   Medication Adjustments/Labs and Tests Ordered: Current medicines are reviewed at length with the patient today.  Concerns regarding medicines are outlined above.  No orders of the defined types were placed in this encounter.  No orders of the defined types were placed in this encounter.   No chief complaint on file.   History of Present Illness:    Lee Strong is a 78 y.o. male with a hx of  chronic atrial fibrillation heart failure and hypertension last seen 01/09/18.Marland Kitchen Compliance with diet, lifestyle and medications: Yes  He has made a good recovery from his total knee arthroplasty feels well tells me he had orthostatic lightheadedness both his beta-blocker was decreased and a ARB was discontinued since then his weight is stable no edema shortness of breath chest pain palpitation or syncope and tolerates his anticoagulant without any  bleeding side effect Past Medical History:  Diagnosis Date  . Atrial fibrillation ([place]) 06/17/2017  . Chronic atrial fibrillation 12/20/2014   Overview:  .CHADS2 vasc score=2  . Complication of anesthesia   . Essential hypertension 12/20/2014  . Hyperlipidemia 06/17/2017  . Hypertension 06/17/2017  . Long term (current) use of anticoagulants 12/20/2014  . PONV (postoperative nausea and vomiting)   . Prostate cancer ([place])   . Short of breath on exertion 06/17/2017  . Weakness 06/17/2017    Past Surgical History:  Procedure Laterality Date  . HERNIA REPAIR    . PROSTATECTOMY    . TONSILLECTOMY AND ADENOIDECTOMY    . 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 furosemide (LASIX) 40 MG tablet TAKE ONE (1) TABLET BY MOUTH ONCE DAILY  . metoprolol succinate (TOPROL-XL) 100 MG 24 hr tablet Take 50 mg by mouth 2 (two) times a day. Take with or immediately following a meal.  . spironolactone (ALDACTONE) 25 MG tablet TAKE ONE-HALF TABLET DAILY  . vitamin C (ASCORBIC ACID) 500 MG tablet Take 500 mg by mouth daily.  Marland Kitchen warfarin (COUMADIN) 4 MG tablet Take 4 mg by mouth daily.      Allergies:   Penicillins   Social History   Socioeconomic History  . Marital status: Married    Spouse  name: Not on file  . Number of children: Not on file  . Years of education: Not on file  . Highest education level: Not on file  Occupational History  . Not on file  Social Needs  . Financial resource strain: Not on file  . Food insecurity    Worry: Not on file    Inability: Not on file  . Transportation needs    Medical: Not on file    Non-medical: Not on file  Tobacco Use  . Smoking status: Never Smoker  . Smokeless tobacco: Former Network engineer and Sexual Activity  . Alcohol use: No    Frequency: Never  . Drug use: No   . Sexual activity: Not on file  Lifestyle  . Physical activity    Days per week: Not on file    Minutes per session: Not on file  . Stress: Not on file  Relationships  . Social Herbalist on phone: Not on file    Gets together: Not on file    Attends religious service: Not on file    Active member of club or organization: Not on file    Attends meetings of clubs or organizations: Not on file    Relationship status: Not on file  Other Topics Concern  . Not on file  Social History Narrative   ** Merged History Encounter **         Family History: The patient's family history includes Colon cancer in his brother; Heart attack in his father; Hypertension in his father and mother; Prostate cancer in his brother; Stroke in his mother. ROS:   Please see the history of present illness.    All other systems reviewed and are negative.  EKGs/Labs/Other Studies Reviewed:    The following studies were reviewed today:  EKG:  EKG ordered today and personally reviewed.  The ekg ordered today demonstrates fibrillation controlled ventricular rate right bundle branch block left axis deviation  Recent Labs: 07/13/2018 hemoglobin 11.0 01/09/2018: NT-Pro BNP 1,034 06/24/2018: ALT 27 06/30/2018: BUN 25; Creatinine, Ser 1.00; Hemoglobin 9.5; Platelets 158; Potassium 4.2; Sodium 136  Recent Lipid Panel No results found for: CHOL, TRIG, HDL, CHOLHDL, VLDL, LDLCALC, LDLDIRECT  Physical Exam:    VS:  BP 126/82 (BP Location: Right Arm, Patient Position: Sitting, Cuff Size: Normal)   Pulse 78   Temp (!) 97.3 F (36.3 C)   Wt 169 lb 6.4 oz (76.8 kg)   SpO2 97%   BMI 21.75 kg/m     Wt Readings from Last 3 Encounters:  10/28/18 169 lb 6.4 oz (76.8 kg)  06/29/18 166 lb (75.3 kg)  01/09/18 171 lb 6.4 oz (77.7 kg)     GEN:  Well nourished, well developed in no acute distress HEENT: Normal NECK: No JVD; No carotid bruits LYMPHATICS: No lymphadenopathy CARDIAC: Irregular irregular  variable first heart soundno murmurs, rubs, gallops RESPIRATORY:  Clear to auscultation without rales, wheezing or rhonchi  ABDOMEN: Soft, non-tender, non-distended MUSCULOSKELETAL:  No edema; No deformity  SKIN: Warm and dry NEUROLOGIC:  Alert and oriented x 3 PSYCHIATRIC:  Normal affect    Signed, Shirlee More, MD  10/28/2018 2:33 PM    [place]

## 2018-10-28 ENCOUNTER — Encounter: Payer: Self-pay | Admitting: Cardiology

## 2018-10-28 ENCOUNTER — Ambulatory Visit (INDEPENDENT_AMBULATORY_CARE_PROVIDER_SITE_OTHER): Payer: Medicare Other | Admitting: Cardiology

## 2018-10-28 VITALS — BP 126/82 | HR 78 | Temp 97.3°F | Wt 169.4 lb

## 2018-10-28 DIAGNOSIS — I110 Hypertensive heart disease with heart failure: Secondary | ICD-10-CM | POA: Diagnosis not present

## 2018-10-28 DIAGNOSIS — I482 Chronic atrial fibrillation, unspecified: Secondary | ICD-10-CM

## 2018-10-28 DIAGNOSIS — Z7901 Long term (current) use of anticoagulants: Secondary | ICD-10-CM

## 2018-10-28 MED ORDER — EPLERENONE 25 MG PO TABS: 25.0000 mg | ORAL_TABLET | Freq: Every day | ORAL | 1 refills | Status: DC

## 2018-10-28 NOTE — Patient Instructions (Addendum)
Medication Instructions:  Your physician has recommended you make the following change in your medication:   DISCONTINUE: Spironolactone  START: Inspra (eplerenone) 25 mg: Take 1 tab daily  If you need a refill on your cardiac medications before your next appointment, please call your pharmacy.   Lab work: [place] If you have labs (blood work) drawn today and your tests are completely normal, you will receive your results only by: Marland Kitchen MyChart Message (if you have MyChart) OR . A paper copy in the mail If you have any lab test that is abnormal or we need to change your treatment, we will call you to review the results.  Testing/Procedures: You had an EKG today  Follow-Up: At [facility], you and your health needs are our priority.  As part of our continuing mission to provide you with exceptional heart care, we have created designated Provider Care Teams.  These Care Teams include your primary Cardiologist (physician) and Advanced Practice Providers (APPs -  Physician Assistants and Nurse Practitioners) who all work together to provide you with the care you need, when you need it. You will need a follow up appointment in 1 years.  Any Other Special Instructions Will Be Listed Below (If Applicable).

## 2018-10-29 ENCOUNTER — Telehealth: Payer: Self-pay | Admitting: *Deleted

## 2018-10-29 NOTE — Telephone Encounter (Signed)
Pt was just put on Inspra 25 mg and copay was too expensive. Is there an alternative to this that might be cheaper. Please advise.

## 2018-10-29 NOTE — Telephone Encounter (Signed)
There is no alternative would like him to take Inspra as he had gynecomastia with spironolactone

## 2018-10-29 NOTE — Telephone Encounter (Signed)
Patient was started on inspra 25 mg daily yesterday during his visit with Dr. Bettina Gavia. Patient went to pick up his prescription this morning and states that his copay for it was $47 for a 30 day supply. He can afford this but if there is another medication that can be substituted to replace inspra he would like to try it to see if he can get the medicine cheaper. Please advise.

## 2018-10-30 NOTE — Telephone Encounter (Signed)
Patient informed that Dr. Bettina Gavia recommends to continue inspra as prescribed due to the side effects of spironolactone that he was taking previously. Patient is agreeable and verbalized understanding. No further questions.

## 2018-11-03 ENCOUNTER — Other Ambulatory Visit: Payer: Self-pay | Admitting: Cardiology

## 2018-11-27 ENCOUNTER — Telehealth: Payer: Self-pay | Admitting: Cardiology

## 2018-11-27 NOTE — Telephone Encounter (Signed)
Telephone call to patient. States does not see/feel any difference on Inspra . Wants to go back on spironolactone 25 mg 1/2 tab daily. Is this OK?

## 2018-11-27 NOTE — Telephone Encounter (Signed)
yes

## 2018-11-27 NOTE — Telephone Encounter (Signed)
Has questions about medications

## 2018-11-27 NOTE — Telephone Encounter (Signed)
Telephone call back to patient. Informed OK to stop Inspra and restart spironolactone 25 mg 1/2 tab daily.Patient verbalized understanding

## 2019-01-08 ENCOUNTER — Other Ambulatory Visit: Payer: Self-pay | Admitting: Cardiology

## 2019-02-06 ENCOUNTER — Other Ambulatory Visit: Payer: Self-pay | Admitting: Cardiology

## 2019-02-08 ENCOUNTER — Telehealth: Payer: Self-pay | Admitting: Cardiology

## 2019-02-08 MED ORDER — SPIRONOLACTONE 25 MG PO TABS: 12.5000 mg | ORAL_TABLET | Freq: Every day | ORAL | 1 refills | Status: DC

## 2019-02-08 NOTE — Addendum Note (Signed)
Addended by: Austin Miles on: 02/08/2019 10:25 AM   Modules accepted: Orders

## 2019-02-08 NOTE — Telephone Encounter (Signed)
Called patient to confirm that he wished to discontinue inspra and restart spironolactone after reviewing telephone encounter from 11/27/2018. Patient confirmed that he wishes to restart spironolactone 12.5 mg daily at this time. Refill sent to [facility] as requested. No further questions.

## 2019-02-08 NOTE — Telephone Encounter (Signed)
Call spiron[facility]actone to [facility]

## 2019-05-20 ENCOUNTER — Other Ambulatory Visit: Payer: Self-pay | Admitting: Cardiology

## 2019-08-02 ENCOUNTER — Other Ambulatory Visit: Payer: Self-pay | Admitting: Cardiology

## 2019-09-10 ENCOUNTER — Other Ambulatory Visit: Payer: Self-pay | Admitting: Cardiology

## 2019-10-28 ENCOUNTER — Encounter: Payer: Self-pay | Admitting: Cardiology

## 2019-10-28 ENCOUNTER — Ambulatory Visit: Payer: Medicare Other | Admitting: Cardiology

## 2019-10-28 ENCOUNTER — Other Ambulatory Visit: Payer: Self-pay

## 2019-10-28 VITALS — BP 140/78 | HR 74 | Ht 74.0 in | Wt 173.0 lb

## 2019-10-28 DIAGNOSIS — Z7901 Long term (current) use of anticoagulants: Secondary | ICD-10-CM

## 2019-10-28 DIAGNOSIS — I110 Hypertensive heart disease with heart failure: Secondary | ICD-10-CM

## 2019-10-28 DIAGNOSIS — I482 Chronic atrial fibrillation, unspecified: Secondary | ICD-10-CM

## 2019-10-28 NOTE — Progress Notes (Signed)
Cardiology Office Note:    Date:  10/28/2019   ID:  Lee Strong [place], [place] [birth year], MRN [id number]  PCP:  Myrlene Broker, MD  Cardiologist:  Shirlee More, MD    Referring MD: Cyndy Freeze, MD    ASSESSMENT:    1. Chronic atrial fibrillation ([place])   2. Chronic anticoagulation   3. Hypertensive heart disease with heart failure ([place])    PLAN:    In order of problems listed above:  1. Rate is controlled blocker and anticoagulant goal INR 2.5 managed with his PCP 2. Continue his current anticoagulant he does not want to take the direct agents 3. BP at target no evidence of heart failure reduce his diuretic and continue beta-blocker loop diuretic and MRA.  At this time I do not think he requires a repeat echocardiogram.  We will check labs today including proBNP level if severely elevated I would do an echocardiogram and check renal function potassium   Next appointment: 1 year   Medication Adjustments/Labs and Tests Ordered: Current medicines are reviewed at length with the patient today.  Concerns regarding medicines are outlined above.  No orders of the defined types were placed in this encounter.  No orders of the defined types were placed in this encounter.   Chief Complaint  Patient presents with  . Annual Exam  . Atrial Fibrillation    History of Present Illness:    SAMAJ WESSELLS is a 79 y.o. male with a hx of chronic atrial fibrillation heart failure and hypertension  last seen 10/28/2018. Compliance with diet, lifestyle and medications: Yes  He has had a good year he questions whether he needs to take the diuretic every day and we negotiated he got a Monday Wednesday Friday any day weighs 175 pounds or greater in between her take a dose and is pleased with this.  He has had no edema shortness of breath chest pain palpitations syncope tolerates his medications and has had no bleeding complications from his current anticoagulant.  His anticoagulation is  managed by his PCP Past Medical History:  Diagnosis Date  . Atrial fibrillation ([place]) 06/17/2017  . Chronic atrial fibrillation ([place]) 12/20/2014   Overview:  .CHADS2 vasc score=2  . Complication of anesthesia   . Essential hypertension 12/20/2014  . Hyperlipidemia 06/17/2017  . Hypertension 06/17/2017  . Long term (current) use of anticoagulants 12/20/2014  . PONV (postoperative nausea and vomiting)   . Prostate cancer ([place])   . Short of breath on exertion 06/17/2017  . Weakness 06/17/2017    Past Surgical History:  Procedure Laterality Date  . HERNIA REPAIR    . PROSTATECTOMY    . TONSILLECTOMY AND ADENOIDECTOMY    . 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 furosemide (LASIX) 40 MG tablet TAKE ONE (1) TABLET BY MOUTH ONCE DAILY  . metoprolol succinate (TOPROL-XL) 100 MG 24 hr tablet TAKE ONE-HALF TABLET (1/2) BY MOUTH TWICE A DAY. PLEASE CONTACT OFFICE TO SCHEDULE APPOINTMENT FOR FURTHER REFILLS.  Marland Kitchen spironolactone (ALDACTONE) 25 MG tablet TAKE 1/2 TABLET BY MOUTH DAILY  . vitamin C (ASCORBIC ACID) 500 MG tablet Take 500 mg by mouth daily.  Marland Kitchen VITAMIN D PO Take 500 Units by mouth daily.  Marland Kitchen warfarin (COUMADIN) 4 MG tablet Take 4 mg by mouth daily.  Allergies:   Penicillins   Social History   Socioeconomic History  . Marital status: Married    Spouse name: Not on file  . Number of children: Not on file  . Years of education: Not on file  . Highest education level: Not on file  Occupational History  . Not on file  Tobacco Use  . Smoking status: Never Smoker  . Smokeless tobacco: Former Network engineer  . Vaping Use: Never used  Substance and Sexual Activity  . Alcohol use: No  . Drug use: No  . Sexual activity: Not on file  Other Topics Concern  . Not on file  Social History Narrative     ** Merged History Encounter **       Social Determinants of Health   Financial Resource Strain:   . Difficulty of Paying Living Expenses:   Food Insecurity:   . Worried About Charity fundraiser in the Last Year:   . Arboriculturist in the Last Year:   Transportation Needs:   . Film/video editor (Medical):   Marland Kitchen Lack of Transportation (Non-Medical):   Physical Activity:   . Days of Exercise per Week:   . Minutes of Exercise per Session:   Stress:   . Feeling of Stress :   Social Connections:   . Frequency of Communication with Friends and Family:   . Frequency of Social Gatherings with Friends and Family:   . Attends Religious Services:   . Active Member of Clubs or Organizations:   . Attends Archivist Meetings:   Marland Kitchen Marital Status:      Family History: The patient's family history includes Colon cancer in his brother; Heart attack in his father; Hypertension in his father and mother; Prostate cancer in his brother; Stroke in his mother. ROS:   Please see the history of present illness.    All other systems reviewed and are negative.  EKGs/Labs/Other Studies Reviewed:    The following studies were reviewed today:  EKG:  EKG ordered today and personally reviewed.  The ekg ordered today demonstrates atrial fibrillation controlled rate right bundle branch block  Recent Labs: No labs since February 2020  Physical Exam:    VS:  BP 140/78 (BP Location: Right Arm, Patient Position: Sitting, Cuff Size: Normal)   Pulse 74   Ht 6\' 2"  (1.88 m)   Wt 173 lb (78.5 kg)   SpO2 92%   BMI 22.21 kg/m     Wt Readings from Last 3 Encounters:  10/28/19 173 lb (78.5 kg)  10/28/18 169 lb 6.4 oz (76.8 kg)  06/29/18 166 lb (75.3 kg)     GEN:  Well nourished, well developed in no acute distress HEENT: Normal NECK: No JVD; No carotid bruits LYMPHATICS: No lymphadenopathy CARDIAC: Irregular S1 variable  no murmurs, rubs, gallops RESPIRATORY:  Clear to auscultation  without rales, wheezing or rhonchi  ABDOMEN: Soft, non-tender, non-distended MUSCULOSKELETAL:  No edema; No deformity  SKIN: Warm and dry NEUROLOGIC:  Alert and oriented x 3 PSYCHIATRIC:  Normal affect    Signed, Shirlee More, MD  10/28/2019 11:29 AM    [facility]

## 2019-10-28 NOTE — Patient Instructions (Signed)
Medication Instructions:  Your physician has recommended you make the following change in your medication:  CHANGE: Take your furosemide on Monday, Wednesday, and Friday only. Take on the other days only if your weight is above 175 pounds.  *If you need a refill on your cardiac medications before your next appointment, please call your pharmacy*   Lab Work: Your physician recommends that you return for lab work in: [place] If you have labs (blood work) drawn today and your tests are completely normal, you will receive your results only by: Marland Kitchen MyChart Message (if you have MyChart) OR . A paper copy in the mail If you have any lab test that is abnormal or we need to change your treatment, we will call you to review the results.   Testing/Procedures: None   Follow-Up: At [facility], you and your health needs are our priority.  As part of our continuing mission to provide you with exceptional heart care, we have created designated Provider Care Teams.  These Care Teams include your primary Cardiologist (physician) and Advanced Practice Providers (APPs -  Physician Assistants and Nurse Practitioners) who all work together to provide you with the care you need, when you need it.  We recommend signing up for the patient portal called "MyChart".  Sign up information is provided on this After Visit Summary.  MyChart is used to connect with patients for Virtual Visits (Telemedicine).  Patients are able to view lab/test results, encounter notes, upcoming appointments, etc.  Non-urgent messages can be sent to your provider as well.   To learn more about what you can do with MyChart, go to [email or web address].    Your next appointment:   1 year(s)  The format for your next appointment:   In Person  Provider:   Shirlee More, MD   Other Instructions

## 2019-10-29 ENCOUNTER — Telehealth: Payer: Self-pay

## 2019-10-29 LAB — COMPREHENSIVE METABOLIC PANEL
ALT: 23 IU/L (ref 0–44)
AST: 24 IU/L (ref 0–40)
Albumin/Globulin Ratio: 1.6 (ref 1.2–2.2)
Albumin: 4.1 g/dL (ref 3.7–4.7)
Alkaline Phosphatase: 70 IU/L (ref 48–121)
BUN/Creatinine Ratio: 22 (ref 10–24)
BUN: 23 mg/dL (ref 8–27)
Bilirubin Total: 0.4 mg/dL (ref 0.0–1.2)
CO2: 28 mmol/L (ref 20–29)
Calcium: 9.1 mg/dL (ref 8.6–10.2)
Chloride: 105 mmol/L (ref 96–106)
Creatinine, Ser: 1.06 mg/dL (ref 0.76–1.27)
GFR calc Af Amer: 76 mL/min/{1.73_m2} (ref >59)
GFR calc non Af Amer: 66 mL/min/{1.73_m2} (ref >59)
Globulin, Total: 2.5 g/dL (ref 1.5–4.5)
Glucose: 83 mg/dL (ref 65–99)
Potassium: 4.6 mmol/L (ref 3.5–5.2)
Sodium: 142 mmol/L (ref 134–144)
Total Protein: 6.6 g/dL (ref 6.0–8.5)

## 2019-10-29 LAB — PRO B NATRIURETIC PEPTIDE: NT-Pro BNP: 789 pg/mL — ABNORMAL HIGH (ref 0–486)

## 2019-10-29 NOTE — Telephone Encounter (Signed)
-----   Message from Richardo Priest, MD sent at 10/29/2019 10:27 AM EDT ----- Normal or stable result  No changes

## 2019-10-29 NOTE — Telephone Encounter (Signed)
Spoke with patients wife regarding results and recommendation. ° °She verbalizes understanding and is agreeable to plan of care. Advised patient to call back with any issues or concerns.  °

## 2020-01-03 ENCOUNTER — Other Ambulatory Visit: Payer: Self-pay | Admitting: Cardiology

## 2020-01-03 NOTE — Telephone Encounter (Signed)
Rx refill sent to pharmacy. 

## 2020-01-27 ENCOUNTER — Other Ambulatory Visit: Payer: Self-pay | Admitting: Cardiology

## 2020-10-20 ENCOUNTER — Other Ambulatory Visit: Payer: Self-pay | Admitting: Cardiology

## 2020-10-20 NOTE — Telephone Encounter (Signed)
Spironolactone 25 mg # 45 x 2 refill sent to [facility] [place]

## 2020-10-30 ENCOUNTER — Other Ambulatory Visit: Payer: Self-pay

## 2020-10-30 MED ORDER — METOPROLOL SUCCINATE ER 100 MG PO TB24: ORAL_TABLET | ORAL | 0 refills | Status: DC

## 2020-10-30 NOTE — Telephone Encounter (Signed)
Rx approved and sent 

## 2020-11-08 NOTE — Progress Notes (Signed)
Cardiology Office Note:    Date:  11/09/2020   ID:  [name], [name] 09-Oct-1940, MRN [id number]  PCP:  Myrlene Broker, MD  Cardiologist:  Shirlee More, MD    Referring MD: Myrlene Broker, MD    ASSESSMENT:    1. Hypertensive heart disease with heart failure ([place])   2. Chronic atrial fibrillation ([facility])   3. Chronic anticoagulation   4. Essential hypertension    PLAN:    In order of problems listed above:  Lee Strong continues to do well he has no fluid overload New York Heart Association class I continue his current loop diuretic and spironolactone normal renal function and potassium Stable rate controlled continue beta-blocker and his current anticoagulant   Next appointment: 1 year   Medication Adjustments/Labs and Tests Ordered: Current medicines are reviewed at length with the patient today.  Concerns regarding medicines are outlined above.  Orders Placed This Encounter  Procedures   EKG 12-Lead   No orders of the defined types were placed in this encounter.   Chief Complaint  Patient presents with   Follow-up    History of Present Illness:    Lee Strong is a 80 y.o. male with a hx of chronic longstanding atrial fibrillation with anticoagulation and hypertensive heart disease with heart failure.  He was last seen 10/28/2019.  Compliance with diet, lifestyle and medications: Yes  He continues to do well no edema shortness of breath chest pain palpitation or syncope.  He is compliant with his diuretic and sodium restriction.  He has had no bleeding complication from his warfarin.  March 2019 gated pool EF 61%, February 2019 echo EF 45 to 50% with right ventricular enlargement and abnormal septal motion mild to moderate mitral regurgitation mild tricuspid regurgitation and pulmonary artery systolic pressure was estimated in the range of 30 mmHg.  His EKG showed right bundle branch block.  Labs performed [facility] PCP 01/21/2020: Cholesterol  151 LDL 97 triglycerides 58 HDL 49 CMP with a sodium 137 potassium 4.6 creatinine 1.13 normal liver function test Past Medical History:  Diagnosis Date   Atrial fibrillation ([place]) 06/17/2017   Chronic atrial fibrillation ([place]) 12/20/2014   Overview:  .CHADS2 vasc score=2   Complication of anesthesia    Essential hypertension 12/20/2014   Hyperlipidemia 06/17/2017   Hypertension 06/17/2017   Long term (current) use of anticoagulants 12/20/2014   PONV (postoperative nausea and vomiting)    Prostate cancer ([place])    Short of breath on exertion 06/17/2017   Weakness 06/17/2017    Past Surgical History:  Procedure Laterality Date   HERNIA REPAIR     PROSTATECTOMY     TONSILLECTOMY AND ADENOIDECTOMY     TOTAL KNEE ARTHROPLASTY Left 06/29/2018   Procedure: TOTAL KNEE ARTHROPLASTY;  Surgeon: Vickey Huger, MD;  Location: [facility];  Service: Orthopedics;  Laterality: Left;    Current Medications: Current Meds  Medication Sig   furosemide (LASIX) 40 MG tablet TAKE ONE (1) TABLET BY MOUTH ONCE DAILY   metoprolol succinate (TOPROL-XL) 50 MG 24 hr tablet Take 50 mg by mouth 2 (two) times daily.   spironolactone (ALDACTONE) 25 MG tablet TAKE 1/2 TABLET BY MOUTH DAILY   vitamin C (ASCORBIC ACID) 500 MG tablet Take 500 mg by mouth daily.   VITAMIN D PO Take 500 Units by mouth daily.   warfarin (COUMADIN) 4 MG tablet Take 4 mg by mouth daily.    [DISCONTINUED] metoprolol succinate (TOPROL-XL) 100 MG 24 hr tablet  TAKE ONE-HALF TABLET (1/2) BY MOUTH TWICE A DAY.     Allergies:   Penicillins   Social History   Socioeconomic History   Marital status: Married    Spouse name: Not on file   Number of children: Not on file   Years of education: Not on file   Highest education level: Not on file  Occupational History   Not on file  Tobacco Use   Smoking status: Never   Smokeless tobacco: Former  Scientific laboratory technician Use: Never used  Substance and Sexual Activity   Alcohol use: No   Drug use: No    Sexual activity: Not on file  Other Topics Concern   Not on file  Social History Narrative   ** Merged History Encounter **       Social Determinants of Health   Financial Resource Strain: Not on file  Food Insecurity: Not on file  Transportation Needs: Not on file  Physical Activity: Not on file  Stress: Not on file  Social Connections: Not on file     Family History: The patient's family history includes Colon cancer in his brother; Heart attack in his father; Hypertension in his father and mother; Prostate cancer in his brother; Stroke in his mother. ROS:   Please see the history of present illness.    All other systems reviewed and are negative.  EKGs/Labs/Other Studies Reviewed:    The following studies were reviewed today:  EKG:  EKG ordered today and personally reviewed.  The ekg ordered today demonstrates rate controlled atrial fibrillation right bundle branch block  Physical Exam:    VS:  BP 110/90 (BP Location: Left Arm, Patient Position: Sitting, Cuff Size: Normal)   Pulse 81   Ht 6\' 2"  (1.88 m)   Wt 175 lb (79.4 kg)   SpO2 98%   BMI 22.47 kg/m     Wt Readings from Last 3 Encounters:  11/09/20 175 lb (79.4 kg)  10/28/19 173 lb (78.5 kg)  10/28/18 169 lb 6.4 oz (76.8 kg)     GEN: Looks younger than his age well nourished, well developed in no acute distress HEENT: Normal NECK: No JVD; No carotid bruits LYMPHATICS: No lymphadenopathy CARDIAC: Irregular rhythm variable first heart sound no murmurs, rubs, gallops RESPIRATORY:  Clear to auscultation without rales, wheezing or rhonchi  ABDOMEN: Soft, non-tender, non-distended MUSCULOSKELETAL:  No edema; No deformity  SKIN: Warm and dry NEUROLOGIC:  Alert and oriented x 3 PSYCHIATRIC:  Normal affect    Signed, Shirlee More, MD  11/09/2020 10:39 AM    [place]

## 2020-11-09 ENCOUNTER — Ambulatory Visit: Payer: Medicare Other | Admitting: Cardiology

## 2020-11-09 ENCOUNTER — Other Ambulatory Visit: Payer: Self-pay

## 2020-11-09 ENCOUNTER — Encounter: Payer: Self-pay | Admitting: Cardiology

## 2020-11-09 VITALS — BP 110/90 | HR 81 | Ht 74.0 in | Wt 175.0 lb

## 2020-11-09 DIAGNOSIS — I110 Hypertensive heart disease with heart failure: Secondary | ICD-10-CM | POA: Diagnosis not present

## 2020-11-09 DIAGNOSIS — I10 Essential (primary) hypertension: Secondary | ICD-10-CM | POA: Diagnosis not present

## 2020-11-09 DIAGNOSIS — I482 Chronic atrial fibrillation, unspecified: Secondary | ICD-10-CM

## 2020-11-09 DIAGNOSIS — Z7901 Long term (current) use of anticoagulants: Secondary | ICD-10-CM

## 2020-11-09 NOTE — Patient Instructions (Signed)

## 2020-11-30 ENCOUNTER — Other Ambulatory Visit: Payer: Self-pay | Admitting: Cardiology

## 2021-07-11 ENCOUNTER — Other Ambulatory Visit: Payer: Self-pay

## 2021-07-11 MED ORDER — SPIRONOLACTONE 25 MG PO TABS: 12.5000 mg | ORAL_TABLET | Freq: Every day | ORAL | 1 refills | Status: DC

## 2021-08-17 ENCOUNTER — Other Ambulatory Visit: Payer: Self-pay | Admitting: Cardiology

## 2022-01-04 ENCOUNTER — Other Ambulatory Visit: Payer: Self-pay | Admitting: Cardiology

## 2022-01-31 DIAGNOSIS — Z9889 Other specified postprocedural states: Secondary | ICD-10-CM | POA: Insufficient documentation

## 2022-01-31 DIAGNOSIS — C61 Malignant neoplasm of prostate: Secondary | ICD-10-CM | POA: Insufficient documentation

## 2022-02-13 ENCOUNTER — Ambulatory Visit: Payer: Medicare Other | Attending: Cardiology | Admitting: Cardiology

## 2022-02-13 ENCOUNTER — Encounter: Payer: Self-pay | Admitting: Cardiology

## 2022-02-13 VITALS — BP 100/80 | HR 90 | Ht 74.0 in | Wt 170.0 lb

## 2022-02-13 DIAGNOSIS — I110 Hypertensive heart disease with heart failure: Secondary | ICD-10-CM | POA: Diagnosis not present

## 2022-02-13 DIAGNOSIS — I4510 Unspecified right bundle-branch block: Secondary | ICD-10-CM | POA: Diagnosis not present

## 2022-02-13 DIAGNOSIS — I482 Chronic atrial fibrillation, unspecified: Secondary | ICD-10-CM | POA: Diagnosis not present

## 2022-02-13 DIAGNOSIS — Z7901 Long term (current) use of anticoagulants: Secondary | ICD-10-CM | POA: Diagnosis not present

## 2022-02-13 MED ORDER — METOPROLOL SUCCINATE ER 50 MG PO TB24: 50.0000 mg | ORAL_TABLET | Freq: Two times a day (BID) | ORAL | 3 refills | Status: DC

## 2022-02-13 NOTE — Patient Instructions (Signed)
Medication Instructions:  Your physician recommends that you continue on your current medications as directed. Please refer to the Current Medication list given to you today.  *If you need a refill on your cardiac medications before your next appointment, please call your pharmacy*   Lab Work: NONE If you have labs (blood work) drawn today and your tests are completely normal, you will receive your results only by: MyChart Message (if you have MyChart) OR A paper copy in the mail If you have any lab test that is abnormal or we need to change your treatment, we will call you to review the results.   Testing/Procedures: NONE   Follow-Up: At [facility], you and your health needs are our priority.  As part of our continuing mission to provide you with exceptional heart care, we have created designated Provider Care Teams.  These Care Teams include your primary Cardiologist (physician) and Advanced Practice Providers (APPs -  Physician Assistants and Nurse Practitioners) who all work together to provide you with the care you need, when you need it.  We recommend signing up for the patient portal called "MyChart".  Sign up information is provided on this After Visit Summary.  MyChart is used to connect with patients for Virtual Visits (Telemedicine).  Patients are able to view lab/test results, encounter notes, upcoming appointments, etc.  Non-urgent messages can be sent to your provider as well.   To learn more about what you can do with MyChart, go to [email or web address].com.    Your next appointment:   1 year(s)  The format for your next appointment:   In Person  Provider:   Brian Munley, MD    Other Instructions   Important Information About Sugar       

## 2022-02-13 NOTE — Progress Notes (Signed)
Cardiology Office Note:    Date:  02/13/2022   ID:  Lee Strong, Lee Strong 12-04-1940, MRN [id number]  PCP:  Myrlene Broker, MD  Cardiologist:  Shirlee More, MD    Referring MD: Myrlene Broker, MD    ASSESSMENT:    1. Chronic atrial fibrillation ([place])   2. Chronic anticoagulation   3. Hypertensive heart disease with heart failure ([place])   4. RBBB    PLAN:    In order of problems listed above:  Despite the loss of his wife Adrion continues to do well he is asymptomatic from his atrial fibrillation rate controlled with his long-term beta-blocker and stroke prophylaxis with warfarin managed by his PCP: INR goal 2.5 continue these medications Stable BP at target and no evidence of fluid overload and will continue his loop and distal diuretic with normal renal function and potassium Stable EKG pattern   Next appointment: 1 year   Medication Adjustments/Labs and Tests Ordered: Current medicines are reviewed at length with the patient today.  Concerns regarding medicines are outlined above.  Orders Placed This Encounter  Procedures   EKG 12-Lead   Meds ordered this encounter  Medications   metoprolol succinate (TOPROL-XL) 50 MG 24 hr tablet    Sig: Take 1 tablet (50 mg total) by mouth 2 (two) times daily.    Dispense:  180 tablet    Refill:  3    Chief Complaint  Patient presents with   Annual Exam   Atrial Fibrillation    History of Present Illness:    Lee Strong is a 81 y.o. male with a hx of chronic longstanding atrial fibrillation with anticoagulation and hypertensive heart disease with heart failure last seen 11/09/2020.  March 2019 gated pool EF 61%, February 2019 echo EF 45 to 50% with right ventricular enlargement and abnormal septal motion mild to moderate mitral regurgitation mild tricuspid regurgitation and pulmonary artery systolic pressure was estimated in the range of 30 mmHg.  Compliance with diet, lifestyle and medications: Yes  Sadly he  lost his wife this summer from widely metastatic colon cancer He remains very active in his church community physically and has no exercise intolerance edema shortness of breath chest pain palpitation or syncope He tolerates his anticoagulant warfarin without bleeding and his INR managed in his PCP office is therapeutic 2.4 On labs 01/28/2022 GFR 80 cc/min sodium 137 potassium 4.1 Past Medical History:  Diagnosis Date   Atrial fibrillation ([place]) 06/17/2017   Chronic atrial fibrillation ([place]) 12/20/2014   Overview:  .CHADS2 vasc score=2   Complication of anesthesia    Essential hypertension 12/20/2014   Hyperlipidemia 06/17/2017   Hypertension 06/17/2017   Long term (current) use of anticoagulants 12/20/2014   PONV (postoperative nausea and vomiting)    Prostate cancer ([place])    Short of breath on exertion 06/17/2017   Weakness 06/17/2017    Past Surgical History:  Procedure Laterality Date   HERNIA REPAIR     PROSTATECTOMY     TONSILLECTOMY AND ADENOIDECTOMY     TOTAL KNEE ARTHROPLASTY Left 06/29/2018   Procedure: TOTAL KNEE ARTHROPLASTY;  Surgeon: Vickey Huger, MD;  Location: [facility];  Service: Orthopedics;  Laterality: Left;    Current Medications: Current Meds  Medication Sig   furosemide (LASIX) 40 MG tablet Take 1 tablet by mouth daily.   oxybutynin (DITROPAN-XL) 10 MG 24 hr tablet Take 10 mg by mouth daily.   spironolactone (ALDACTONE) 25 MG tablet TAKE 1/2 TABLET BY MOUTH ONCE DAILY.  vitamin C (ASCORBIC ACID) 500 MG tablet Take 500 mg by mouth daily.   VITAMIN D PO Take 500 Units by mouth daily.   warfarin (COUMADIN) 4 MG tablet Take 4 mg by mouth daily.    [DISCONTINUED] metoprolol succinate (TOPROL-XL) 50 MG 24 hr tablet Take 1 tablet (50 mg total) by mouth 2 (two) times daily.     Allergies:   Penicillins   Social History   Socioeconomic History   Marital status: Married    Spouse name: Not on file   Number of children: Not on file   Years of education: Not on file    Highest education level: Not on file  Occupational History   Not on file  Tobacco Use   Smoking status: Never   Smokeless tobacco: Former  Scientific laboratory technician Use: Never used  Substance and Sexual Activity   Alcohol use: No   Drug use: No   Sexual activity: Not on file  Other Topics Concern   Not on file  Social History Narrative   ** Merged History Encounter **       Social Determinants of Health   Financial Resource Strain: Not on file  Food Insecurity: Not on file  Transportation Needs: Not on file  Physical Activity: Not on file  Stress: Not on file  Social Connections: Not on file     Family History: The patient's family history includes Colon cancer in his brother; Heart attack in his father; Hypertension in his father and mother; Prostate cancer in his brother; Stroke in his mother. ROS:   Please see the history of present illness.    All other systems reviewed and are negative.  EKGs/Labs/Other Studies Reviewed:    The following studies were reviewed today:  EKG:  EKG ordered today and personally reviewed.  The ekg ordered today demonstrates atrial fibrillation controlled rate right bundle branch block unchanged from 11/09/2020   Physical Exam:    VS:  BP 100/80 (BP Location: Right Arm, Patient Position: Sitting, Cuff Size: Normal)   Pulse 90   Ht '6\' 2"'$  (1.88 m)   Wt 170 lb (77.1 kg)   SpO2 99%   BMI 21.83 kg/m     Wt Readings from Last 3 Encounters:  02/13/22 170 lb (77.1 kg)  11/09/20 175 lb (79.4 kg)  10/28/19 173 lb (78.5 kg)     GEN:  Well nourished, well developed in no acute distress HEENT: Normal NECK: No JVD; No carotid bruits LYMPHATICS: No lymphadenopathy CARDIAC: Irregular rate and rhythm  no murmurs, rubs, gallops RESPIRATORY:  Clear to auscultation without rales, wheezing or rhonchi  ABDOMEN: Soft, non-tender, non-distended MUSCULOSKELETAL:  No edema; No deformity  SKIN: Warm and dry NEUROLOGIC:  Alert and oriented x  3 PSYCHIATRIC:  Normal affect    Signed, Shirlee More, MD  02/13/2022 11:39 AM    [place]

## 2022-03-25 ENCOUNTER — Other Ambulatory Visit: Payer: Self-pay | Admitting: Cardiology

## 2022-03-25 NOTE — Telephone Encounter (Signed)
Refills sent to pharmacy. 

## 2022-06-26 ENCOUNTER — Telehealth: Payer: Self-pay

## 2022-06-26 NOTE — Telephone Encounter (Signed)
...     Pre-operative Risk Assessment    Patient Name: Lee Strong [facility]  DOB: 11-Nov-1940 MRN: [id number]      Request for Surgical Clearance    Procedure:   COLONOSCOPY  Date of Surgery:  Clearance TBD                                 Surgeon:  Jerel Shepherd Surgeon's Group or Practice Name:  [facility] Phone number:  [phone] Fax number:  [phone]   Type of Clearance Requested:   - Medical  - Pharmacy:  Hold Warfarin (Coumadin)     Type of Anesthesia:  Not Indicated   Additional requests/questions:    Gwenlyn Found   06/26/2022, 10:49 AM

## 2022-06-28 ENCOUNTER — Telehealth: Payer: Self-pay | Admitting: *Deleted

## 2022-06-28 NOTE — Telephone Encounter (Signed)
Pt has been scheduled for tele pre op appt 08/09/22 @ 10:20. Med rec and consent are done.  Pt states his procedure is 09/12/22 but may possibly be moved up sooner if they have a cancellation.     Patient Consent for Virtual Visit        Lee Strong [facility] has provided verbal consent on 06/28/2022 for a virtual visit (video or telephone).   CONSENT FOR VIRTUAL VISIT FOR:  Lee Strong  By participating in this virtual visit I agree to the following:  I hereby voluntarily request, consent and authorize [facility] and its employed or contracted physicians, physician assistants, nurse practitioners or other licensed health care professionals (the Practitioner), to provide me with telemedicine health care services (the "Services") as deemed necessary by the treating Practitioner. I acknowledge and consent to receive the Services by the Practitioner via telemedicine. I understand that the telemedicine visit will involve communicating with the Practitioner through live audiovisual communication technology and the disclosure of certain medical information by electronic transmission. I acknowledge that I have been given the opportunity to request an in-person assessment or other available alternative prior to the telemedicine visit and am voluntarily participating in the telemedicine visit.  I understand that I have the right to withhold or withdraw my consent to the use of telemedicine in the course of my care at any time, without affecting my right to future care or treatment, and that the Practitioner or I may terminate the telemedicine visit at any time. I understand that I have the right to inspect all information obtained and/or recorded in the course of the telemedicine visit and may receive copies of available information for a reasonable fee.  I understand that some of the potential risks of receiving the Services via telemedicine include:  Delay or interruption in medical evaluation due  to technological equipment failure or disruption; Information transmitted may not be sufficient (e.g. poor resolution of images) to allow for appropriate medical decision making by the Practitioner; and/or  In rare instances, security protocols could fail, causing a breach of personal health information.  Furthermore, I acknowledge that it is my responsibility to provide information about my medical history, conditions and care that is complete and accurate to the best of my ability. I acknowledge that Practitioner's advice, recommendations, and/or decision may be based on factors not within their control, such as incomplete or inaccurate data provided by me or distortions of diagnostic images or specimens that may result from electronic transmissions. I understand that the practice of medicine is not an exact science and that Practitioner makes no warranties or guarantees regarding treatment outcomes. I acknowledge that a copy of this consent can be made available to me via my patient portal ([name]), or I can request a printed copy by calling the office of [facility].    I understand that my insurance will be billed for this visit.   I have read or had this consent read to me. I understand the contents of this consent, which adequately explains the benefits and risks of the Services being provided via telemedicine.  I have been provided ample opportunity to ask questions regarding this consent and the Services and have had my questions answered to my satisfaction. I give my informed consent for the services to be provided through the use of telemedicine in my medical care

## 2022-06-28 NOTE — Telephone Encounter (Signed)
Primary card is Dr. Bettina Gavia. Pt has been scheduled for tele pre op appt 08/09/22 @ 10:20. Med rec and consent are done.  Pt states his procedure is 09/12/22 but may possibly be moved up sooner if they have a cancellation.

## 2022-06-28 NOTE — Telephone Encounter (Signed)
Patient with diagnosis of afib on warfarin for anticoagulation.    Procedure: colonoscopy Date of procedure: TBD   CHA2DS2-VASc Score = 5   This indicates a 7.2% annual risk of stroke. The patient's score is based upon: CHF History: 1 HTN History: 1 Diabetes History: 0 Stroke History: 0 Vascular Disease History: 1 Age Score: 2 Gender Score: 0      CrCl 65 ml/min  Per office protocol, patient can hold warfarin for 5 days prior to procedure.    Patient will NOT need bridging with Lovenox (enoxaparin) around procedure.  **This guidance is not considered finalized until pre-operative APP has relayed final recommendations.**

## 2022-06-28 NOTE — Telephone Encounter (Signed)
   Name: Jospeh Sutherland [facility]  DOB: 12/04/1940  MRN: [id number]  Primary Cardiologist: None   Preoperative team, please contact this patient and set up a phone call appointment for further preoperative risk assessment. Please obtain consent and complete medication review. Thank you for your help.  I confirm that guidance regarding antiplatelet and oral anticoagulation therapy has been completed and, if necessary, noted below. Per pharm D: Procedure: colonoscopy Date of procedure: TBD     CHA2DS2-VASc Score = 5   This indicates a 7.2% annual risk of stroke. The patient's score is based upon: CHF History: 1 HTN History: 1 Diabetes History: 0 Stroke History: 0 Vascular Disease History: 1 Age Score: 2 Gender Score: 0    CrCl 65 ml/min   Per office protocol, patient can hold warfarin for 5 days prior to procedure.     Patient will NOT need bridging with Lovenox (enoxaparin) around procedure.   Mayra Reel, NP 06/28/2022, 12:56 PM [place]

## 2022-08-09 ENCOUNTER — Ambulatory Visit: Payer: Medicare Other | Attending: Cardiovascular Disease

## 2022-08-09 DIAGNOSIS — Z01810 Encounter for preprocedural cardiovascular examination: Secondary | ICD-10-CM | POA: Diagnosis not present

## 2022-08-09 NOTE — Progress Notes (Signed)
Virtual Visit via Telephone Note   Because of Lee Strong's co-morbid illnesses, he is at least at moderate risk for complications without adequate follow up.  This format is felt to be most appropriate for this patient at this time.  The patient did not have access to video technology/had technical difficulties with video requiring transitioning to audio format only (telephone).  All issues noted in this document were discussed and addressed.  No physical exam could be performed with this format.  Please refer to the patient's chart for his consent to telehealth for [facility].  Evaluation Performed:  Preoperative cardiovascular risk assessment _____________   Date:  08/09/2022   Patient ID:  Lee Strong, DOB 01/13/1941, MRN [id number] Patient Location:  Home Provider location:   Office  Primary Care Provider:  Hadley Pen, MD Primary Cardiologist:  None  Chief Complaint / Patient Profile   82 y.o. y/o male with a h/o atrial fibrillation, hypertension, RBBB who is pending colonoscopy and presents today for telephonic preoperative cardiovascular risk assessment.  History of Present Illness    Lee Strong is a 82 y.o. male who presents via audio/video conferencing for a telehealth visit today.  Pt was last seen in cardiology clinic on 02/13/2022 by Dr. Dulce Sellar.  At that time Lee Strong was doing well .  The patient is now pending procedure as outlined above. Since his last visit, he remains stable from a cardiac standpoint.  Today he denies chest pain, shortness of breath, lower extremity edema, fatigue, palpitations,  hematuria, hemoptysis, diaphoresis, weakness, presyncope, syncope, orthopnea, and PND.   Past Medical History    Past Medical History:  Diagnosis Date   Atrial fibrillation ([facility]) 06/17/2017   Chronic atrial fibrillation ([facility]) 12/20/2014   Overview:  .CHADS2 vasc score=2   Complication of anesthesia    Essential hypertension  12/20/2014   Hyperlipidemia 06/17/2017   Hypertension 06/17/2017   Long term (current) use of anticoagulants 12/20/2014   PONV (postoperative nausea and vomiting)    Prostate cancer (H[facility])    Short of breath on exertion 06/17/2017   Weakness 06/17/2017   Past Surgical History:  Procedure Laterality Date   HERNIA REPAIR     PROSTATECTOMY     TONSILLECTOMY AND ADENOIDECTOMY     TOTAL KNEE ARTHROPLASTY Left 06/29/2018   Procedure: TOTAL KNEE ARTHROPLASTY;  Surgeon: Dannielle Huh, MD;  Location: [facility];  Service: Orthopedics;  Laterality: Left;    Allergies  Allergies  Allergen Reactions   Penicillins Swelling and Other (See Comments)    Has patient had a PCN reaction causing immediate rash, facial/tongue/throat swelling, SOB or lightheadedness with hypotension: Unknown Has patient had a PCN reaction causing severe rash involving mucus membranes or skin necrosis: Unknown Has patient had a PCN reaction that required hospitalization: Unknown Has patient had a PCN reaction occurring within the last 10 years: No If all of the above answers are "NO", then may proceed with Cephalosporin use.     Home Medications    Prior to Admission medications   Medication Sig Start Date End Date Taking? Authorizing Provider  furosemide (LASIX) 40 MG tablet Take 1 tablet by mouth daily. 02/19/21   [provider]  metoprolol succinate (TOPROL-XL) 50 MG 24 hr tablet Take 1 tablet (50 mg total) by mouth 2 (two) times daily. 02/13/22   Baldo Daub, MD  oxybutynin (DITROPAN-XL) 10 MG 24 hr tablet Take 10 mg by mouth daily. 01/28/22   [provider]  spironolactone (ALDACTONE) 25 MG tablet TAKE 1/2 TABLET BY MOUTH ONCE DAILY. 03/25/22   Baldo Daub, MD  vitamin C (ASCORBIC ACID) 500 MG tablet Take 500 mg by mouth daily.    [provider]  VITAMIN D PO Take 500 Units by mouth daily.    [provider]  warfarin (COUMADIN) 4 MG tablet Take 4 mg by mouth daily.      [provider]    Physical Exam    Vital Signs:  [facility] does not have vital signs available for review today.  Given telephonic nature of communication, physical exam is limited. AAOx3. NAD. Normal affect.  Speech and respirations are unlabored.  Accessory Clinical Findings    None  Assessment & Plan    1.  Preoperative Cardiovascular Risk Assessment: colonoscopy ,  Terrilee Files ,  [facility]       Primary Cardiologist: Dr. Dulce Sellar  Chart reviewed as part of pre-operative protocol coverage. Given past medical history and time since last visit, based on ACC/AHA guidelines, ISIAIH SCHWEIKART would be at acceptable risk for the planned procedure without further cardiovascular testing.    Per office protocol, patient can hold warfarin for 5 days prior to procedure.     Patient will NOT need bridging with Lovenox (enoxaparin) around procedure.  Patient was advised that if he develops new symptoms prior to surgery to contact our office to arrange a follow-up appointment.  He verbalized understanding.  I will route this recommendation to the requesting party via Epic fax function and remove from pre-op pool.       Time:   Today, I have spent 5 minutes with the patient with telehealth technology discussing medical history, symptoms, and management plan.  Prior to his phone evaluation I spent greater than 10 minutes reviewing his past medical history and cardiac medications.   Ronney Asters, NP  08/09/2022, 7:36 AM

## 2022-10-03 ENCOUNTER — Telehealth: Payer: Self-pay

## 2022-10-03 DIAGNOSIS — Z01818 Encounter for other preprocedural examination: Secondary | ICD-10-CM

## 2022-10-03 NOTE — Telephone Encounter (Signed)
   Pre-operative Risk Assessment    Patient Name: Lee Strong [facility]  DOB: 03/30/1941 MRN: [id number]      Request for Surgical Clearance    Procedure:   PORT-A-CATH INSERTION   Date of Surgery:  Clearance TBD                                 Surgeon:  DR. Georgiana Shore Surgeon's Group or Practice Name:  [facility]  Phone number:  [phone] Fax number:  [phone]   Type of Clearance Requested:   - Pharmacy:  Hold Warfarin (Coumadin) NEEDS INSTRUCTION WHEN TO STOP PRIOR TO SURGERY   Type of Anesthesia:  General    Additional requests/questions:    SignedMichaelle Copas   10/03/2022, 3:41 PM

## 2022-10-06 NOTE — Telephone Encounter (Signed)
Patient with diagnosis of atrial fibrillation on warfarin for anticoagulation.    Procedure: port-a-cath insertion Date of procedure: TBD   CHA2DS2-VASc Score = 5   This indicates a 7.2% annual risk of stroke. The patient's score is based upon: CHF History: 1 HTN History: 1 Diabetes History: 0 Stroke History: 0 Vascular Disease History: 1 Age Score: 2 Gender Score: 0    CrCl 64 Platelet count - no CBC in past year  If updated CBC is WNL:   Per office protocol, patient can hold warfarin for 5 days prior to procedure.   Patient will not need bridging with Lovenox (enoxaparin) around procedure.  **This guidance is not considered finalized until pre-operative APP has relayed final recommendations.**

## 2022-10-07 NOTE — Telephone Encounter (Signed)
Patient states that he will go to the [place] office to have lab drawn on Wednesday, June 5

## 2022-10-07 NOTE — Addendum Note (Signed)
Addended by: Vernard Gambles on: 10/07/2022 12:44 PM   Modules accepted: Orders

## 2022-10-07 NOTE — Telephone Encounter (Signed)
Pre-op team,  Patient needs updated CBC per pharm D.   Thank you!  DW

## 2022-10-07 NOTE — Addendum Note (Signed)
Addended by: Vernard Gambles on: 10/07/2022 12:45 PM   Modules accepted: Orders

## 2022-10-09 ENCOUNTER — Other Ambulatory Visit: Payer: Self-pay | Admitting: *Deleted

## 2022-10-09 DIAGNOSIS — Z01818 Encounter for other preprocedural examination: Secondary | ICD-10-CM

## 2022-10-10 LAB — CBC
Hematocrit: 38.4 % (ref 37.5–51.0)
Hemoglobin: 12.9 g/dL — ABNORMAL LOW (ref 13.0–17.7)
MCH: 30 pg (ref 26.6–33.0)
MCHC: 33.6 g/dL (ref 31.5–35.7)
MCV: 89 fL (ref 79–97)
Platelets: 218 10*3/uL (ref 150–450)
RBC: 4.3 x10E6/uL (ref 4.14–5.80)
RDW: 13.2 % (ref 11.6–15.4)
WBC: 6.8 10*3/uL (ref 3.4–10.8)

## 2022-10-10 NOTE — Telephone Encounter (Signed)
CBC shows platelet count at 218.  Okay to hold warfarin x 5 days without bridge

## 2022-10-10 NOTE — Telephone Encounter (Signed)
   Patient Name: Lee Strong  DOB: 1941-03-15 MRN: [id number]  Primary Cardiologist: None  Chart reviewed as part of pre-operative protocol coverage. Pre-op clearance already addressed by colleagues in earlier phone notes. To summarize recommendations:  -CBC shows platelet count at 218. Okay to hold warfarin x 5 days without bridge   Medical clearance was not requested.  Will route this bundled recommendation to requesting provider via Epic fax function and remove from pre-op pool. Please call with questions.  Sharlene Dory, PA-C 10/10/2022, 4:15 PM

## 2022-10-17 ENCOUNTER — Other Ambulatory Visit: Payer: Self-pay | Admitting: Oncology

## 2022-10-17 ENCOUNTER — Inpatient Hospital Stay: Payer: Medicare Other | Attending: Oncology | Admitting: Oncology

## 2022-10-17 ENCOUNTER — Encounter: Payer: Self-pay | Admitting: Oncology

## 2022-10-17 ENCOUNTER — Inpatient Hospital Stay: Payer: Medicare Other

## 2022-10-17 VITALS — BP 113/74 | HR 80 | Temp 97.6°F | Resp 16 | Ht 74.0 in | Wt 164.0 lb

## 2022-10-17 DIAGNOSIS — Z8042 Family history of malignant neoplasm of prostate: Secondary | ICD-10-CM | POA: Diagnosis not present

## 2022-10-17 DIAGNOSIS — C20 Malignant neoplasm of rectum: Secondary | ICD-10-CM

## 2022-10-17 DIAGNOSIS — Z800 Family history of malignant neoplasm of digestive organs: Secondary | ICD-10-CM | POA: Insufficient documentation

## 2022-10-17 LAB — CBC AND DIFFERENTIAL
HCT: 41 (ref 41–53)
Hemoglobin: 13.8 (ref 13.5–17.5)
Neutrophils Absolute: 5.04
Platelets: 223 10*3/uL (ref 150–400)
WBC: 8

## 2022-10-17 LAB — CMP (CANCER CENTER ONLY)
ALT: 17 U/L (ref 0–44)
AST: 19 U/L (ref 15–41)
Albumin: 3.6 g/dL (ref 3.5–5.0)
Alkaline Phosphatase: 59 U/L (ref 38–126)
Anion gap: 5 (ref 5–15)
BUN: 20 mg/dL (ref 8–23)
CO2: 27 mmol/L (ref 22–32)
Calcium: 8.9 mg/dL (ref 8.9–10.3)
Chloride: 103 mmol/L (ref 98–111)
Creatinine: 1.17 mg/dL (ref 0.61–1.24)
GFR, Estimated: >60 mL/min (ref >60)
Glucose, Bld: 90 mg/dL (ref 70–99)
Potassium: 4.3 mmol/L (ref 3.5–5.1)
Sodium: 135 mmol/L (ref 135–145)
Total Bilirubin: 0.4 mg/dL (ref 0.3–1.2)
Total Protein: 7.1 g/dL (ref 6.5–8.1)

## 2022-10-17 LAB — CBC: RBC: 4.54 (ref 3.87–5.11)

## 2022-10-17 NOTE — Progress Notes (Signed)
[facility]  [address] [place],  [place]  [address] [phone]  Clinic Day:  10/17/2022  Referring physician: Hadley Pen, MD   HISTORY OF PRESENT ILLNESS:  The patient is an 82 y.o. male  who I was asked to consult upon for newly diagnosed rectal cancer.  Since the end of 2023, the patient recalls having intermittent rectal bleeding.  When this first occurred in late 2023, he came to the emergency room for further evaluation.  He was apparently told that it may just be diverticulitis, but it was recommended that he at least undergo a colonoscopy for further evaluation.  However, the patient elected not to do this.  He tried other measures to see if they would cause his rectal bleeding to dissipate.  However, over numerous months, his rectal bleeding persisted until he finally went to a Biomedical engineer for further evaluation.  In May 2024, a colonoscopy was done, which showed a suspicious mass in his rectum.  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  Just recently, the patient underwent a pelvic MRI to better ascertain the TNM staging of his rectal mass.  This study showed that his mass measured 5.1 cm in length.  There was extension of this mass through his muscularis propria.  However, there was no evidence of any suspicious-looking regional lymph nodes.  This gentleman had also undergone CT scans of his chest/abdomen/pelvis, which showed no evidence of any distant metastasis.  He comes in today to go over his biopsy and scan results, as well as their implications.  He denies having any weight loss since the beginning of this calendar year.  He also denies having any particular changes in his health during this same time.  PAST MEDICAL HISTORY:   Past Medical History:  Diagnosis Date   Atrial fibrillation (HCC) 06/17/2017   Chronic atrial fibrillation (HCC) 12/20/2014   Overview:  .CHADS2  vasc score=2   Complication of anesthesia    Essential hypertension 12/20/2014   Hyperlipidemia 06/17/2017   Hypertension 06/17/2017   Long term (current) use of anticoagulants 12/20/2014   PONV (postoperative nausea and vomiting)    Prostate cancer (HCC)    Short of breath on exertion 06/17/2017   Weakness 06/17/2017    PAST SURGICAL HISTORY:   Past Surgical History:  Procedure Laterality Date   EXTERNAL AUDITORY CANAL RECONSTRUCTION Left    HERNIA REPAIR Bilateral    INGUINAL   PROSTATECTOMY     TONSILLECTOMY AND ADENOIDECTOMY     TOTAL KNEE ARTHROPLASTY Left 06/29/2018   Procedure: TOTAL KNEE ARTHROPLASTY;  Surgeon: Dannielle Huh, MD;  Location: [facility];  Service: Orthopedics;  Laterality: Left;    CURRENT MEDICATIONS:   Current Outpatient Medications  Medication Sig Dispense Refill   furosemide (LASIX) 40 MG tablet Take 1 tablet by mouth daily.     metoprolol succinate (TOPROL-XL) 50 MG 24 hr tablet Take 1 tablet (50 mg total) by mouth 2 (two) times daily. 180 tablet 3   oxybutynin (DITROPAN-XL) 10 MG 24 hr tablet Take 10 mg by mouth daily.     spironolactone (ALDACTONE) 25 MG tablet TAKE 1/2 TABLET BY MOUTH ONCE DAILY. 45 tablet 8   vitamin C (ASCORBIC ACID) 500 MG tablet Take 500 mg by mouth daily.     VITAMIN D PO Take 500 Units by mouth daily.     warfarin (COUMADIN) 4 MG tablet Take 4 mg by mouth daily.  No current facility-administered medications for this visit.    ALLERGIES:   Allergies  Allergen Reactions   Penicillins Swelling and Other (See Comments)    Has patient had a PCN reaction causing immediate rash, facial/tongue/throat swelling, SOB or lightheadedness with hypotension: Unknown Has patient had a PCN reaction causing severe rash involving mucus membranes or skin necrosis: Unknown Has patient had a PCN reaction that required hospitalization: Unknown Has patient had a PCN reaction occurring within the last 10 years: No If all of the above answers are  "NO", then may proceed with Cephalosporin use.     FAMILY HISTORY:   Family History  Problem Relation Age of Onset   Stroke Mother    Hypertension Mother    Heart attack Father    Hypertension Father    Prostate cancer Brother    Crohn's disease Brother    Colon cancer Brother     SOCIAL HISTORY:  The patient was born and raised in [place].  He lives [place] town by himself.  He is widowed, with 1 child.  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  He denies a history of tobacco or alcohol abuse.  REVIEW OF SYSTEMS:  Review of Systems  Constitutional:  Negative for fatigue, fever and unexpected weight change.  Respiratory:  Negative for chest tightness, cough, hemoptysis and shortness of breath.   Cardiovascular:  Negative for chest pain and palpitations.  Gastrointestinal:  Positive for blood in stool. Negative for abdominal distention, abdominal pain, constipation, diarrhea, nausea and vomiting.  Genitourinary:  Negative for dysuria, frequency and hematuria.   Musculoskeletal:  Positive for arthralgias. Negative for back pain and myalgias.  Skin:  Negative for itching and rash.  Neurological:  Negative for dizziness, headaches and light-headedness.  Psychiatric/Behavioral:  Negative for depression and suicidal ideas. The patient is not nervous/anxious.     PHYSICAL EXAM:  Blood pressure 113/74, pulse 80, temperature 97.6 F (36.4 C), resp. rate 16, height 6\' 2"  (1.88 m), weight 164 lb (74.4 kg), SpO2 92 %. Wt Readings from Last 3 Encounters:  10/17/22 164 lb (74.4 kg)  02/13/22 170 lb (77.1 kg)  11/09/20 175 lb (79.4 kg)   Body mass index is 21.06 kg/m. Performance status (ECOG): 1 - Symptomatic but completely ambulatory Physical Exam Constitutional:      Appearance: Normal appearance. He is not ill-appearing.  HENT:     Mouth/Throat:     Mouth: Mucous membranes are moist.     Pharynx: Oropharynx is clear. No oropharyngeal exudate or  posterior oropharyngeal erythema.  Cardiovascular:     Rate and Rhythm: Normal rate and regular rhythm.     Heart sounds: No murmur heard.    No friction rub. No gallop.  Pulmonary:     Effort: Pulmonary effort is normal. No respiratory distress.     Breath sounds: Normal breath sounds. No wheezing, rhonchi or rales.  Abdominal:     General: Bowel sounds are normal. There is no distension.     Palpations: Abdomen is soft. There is no mass.     Tenderness: There is no abdominal tenderness.  Musculoskeletal:        General: No swelling.     Right lower leg: No edema.     Left lower leg: No edema.  Lymphadenopathy:     Cervical: No cervical adenopathy.     Upper Body:     Right upper body: No supraclavicular or axillary adenopathy.  Left upper body: No supraclavicular or axillary adenopathy.     Lower Body: No right inguinal adenopathy. No left inguinal adenopathy.  Skin:    General: Skin is warm.     Coloration: Skin is not jaundiced.     Findings: No lesion or rash.  Neurological:     General: No focal deficit present.     Mental Status: He is alert and oriented to person, place, and time. Mental status is at baseline.  Psychiatric:        Mood and Affect: Mood normal.        Behavior: Behavior normal.        Thought Content: Thought content normal.   LABS:      Latest Ref Rng & Units 10/17/2022   12:00 AM 10/09/2022   11:31 AM 06/30/2018    3:41 AM  CBC  WBC  8.0     6.8  11.3   Hemoglobin 13.5 - 17.5 13.8     12.9  9.5   Hematocrit 41 - 53 41     38.4  30.6   Platelets 150 - 400 K/uL 223     218  158      This result is from an external source.      Latest Ref Rng & Units 10/17/2022    3:15 PM 10/28/2019   11:35 AM 06/30/2018    3:41 AM  CMP  Glucose 70 - 99 mg/dL 90  83  161   BUN 8 - 23 mg/dL 20  23  25    Creatinine 0.61 - 1.24 mg/dL 0.96  0.45  4.09   Sodium 135 - 145 mmol/L 135  142  136   Potassium 3.5 - 5.1 mmol/L 4.3  4.6  4.2   Chloride 98 - 111 mmol/L  103  105  107   CO2 22 - 32 mmol/L 27  28  25    Calcium 8.9 - 10.3 mg/dL 8.9  9.1  8.2   Total Protein 6.5 - 8.1 g/dL 7.1  6.6    Total Bilirubin 0.3 - 1.2 mg/dL 0.4  0.4    Alkaline Phos 38 - 126 U/L 59  70    AST 15 - 41 U/L 19  24    ALT 0 - 44 U/L 17  23      Latest Reference Range & Units 10/17/22 15:15  CEA 0.0 - 4.7 ng/mL 4.4    STUDIES:  His pelvic MRI done on 10/25/2022 revealed the following:  FINDINGS: TUMOR LOCATION  Tumor distance from Anal Verge/Skin surface: 6.7 cm  Tumor distance to Internal Anal sphincter: 2.5 cm  TUMOR DESCRIPTION  Circumferential extent: CircumferentialMaximal thickness approaches 13 mm.  Tumor Size and volume: 5.1 cm in length.  T - CATEGORY  Extension through Muscularis Propria: Yes T3b. Left side up to 4 mm.  Shortest Distance of any tumor/node from Mesorectal fascia: 11 mm  Extramural Vascular Invasion/Tumor Thrombus: Suspicious vein on the left side at 3 o'clock position on series 5, image 30. coronal series 9, image 14. Additional possible focus on the right.  Invasion of Anterior Peritoneal Reflection: No  Involvement of Adjacent Organs or Pelvic Sidewall: No  Levator Ani Involvement: No  N - CATEGORY  Mesorectal Lymph Nodes >=93mm: No  Extra-mesorectal Lymphadenopathy: No  Other: Extensive colonic diverticulosis. No free fluid in the pelvis. No definite marrow or soft tissue edema elsewhere with scattered degenerative changes. Preserved contours of the urinary bladder which is underdistended.  IMPRESSION: Rectal  adenocarcinoma T stage: T3b. Prominent left-sided vein with a atypical signal. Focus of left-sided vascular invasion  Rectal adenocarcinoma N stage: N0  Distance from tumor to the internal anal sphincter is 2.5 cm cm.  ASSESSMENT & PLAN:  An 82 y.o. 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  In clinic today, I went all over all of his biopsy  and scan results with him.  He understands that as he does not have any evidence of distant metastasis, all therapy to be given for his rectal cancer management will be with the goal of curative intent.  Although he is 82 years old, he does appear healthy enough to where a total neoadjuvant treatment approach can at least be started.  As he has had rectal bleeding for over 6 months, I do want to start him with chemoradiation, with his chemotherapy consisting of infusional 5-fluorouracil.  This, in conjunction with his radiation, should hopefully lead to a rapid cessation of his persistent rectal bleeding.  After that phase of treatment is completed, I will give him 8 cycles of neoadjuvant FOLFOX chemotherapy.  If scans afterwards show a significant improvement in his disease burden with no evidence of metastatic disease having developed over time, the patient understands that rectal surgery would be performed.  Although he understands that all treatment he is to receive for his rectal cancer is being done with curative intent, the patient wants more time to think about if he wants to go through all of this at this point in his life.  I told the patient to contact our office as quickly as possible with his decision so we can get moving immediately with his disease management, if he chooses to pursue it.  I will tentatively see him back in 4 weeks for repeat clinical assessment.  The patient understands all the plans discussed today and is in agreement with them.  I do appreciate Hadley Pen, MD for his new consult.   Dimonique Bourdeau Kirby Funk, MD

## 2022-10-18 ENCOUNTER — Inpatient Hospital Stay: Payer: Medicare Other | Admitting: Licensed Clinical Social Worker

## 2022-10-18 LAB — CEA: CEA: 4.4 ng/mL (ref 0.0–4.7)

## 2022-10-18 NOTE — Progress Notes (Signed)
[facility] Clinical Social Work  Initial Assessment   Lee Strong is a 82 y.o. year old male contacted by phone. Clinical Social Work was referred by medical provider for assessment of psychosocial needs.   SDOH (Social Determinants of Health) assessments performed: Yes   SDOH Screenings   Food Insecurity: No Food Insecurity (10/17/2022)  Housing: Low Risk  (10/17/2022)  Transportation Needs: No Transportation Needs (10/17/2022)  Utilities: Not At Risk (10/17/2022)  Depression (PHQ2-9): Low Risk  (10/17/2022)  Tobacco Use: Medium Risk (10/17/2022)    Family/Social Information:  Family members/support persons in your life? Family (daughter, 2 grandkids, 3 great grandchildren), Friends, and Estate agent concerns: no  Employment: Retired .  Income source: Actor concerns: No Type of concern: None Food access concerns: no Religious or spiritual practice: Yes-very strong faith in God and involvement in church Services Currently in place:  [facility] Medicare  Coping/ Adjustment to diagnosis: Patient understands treatment plan and what happens next? yes, is happy with the information received yesterday and plans to call MD next week with his decision on treatment. Is hopeful for recovery, but feels assured that he will be taken care of by God regardless of the outcome Concerns about diagnosis and/or treatment: I'm not especially worried about anything Current coping skills/ strengths: Ability for insight , Capable of independent living , Communication skills , Motivation for treatment/growth , Religious Affiliation , and Supportive family/friends     SUMMARY: Current SDOH Barriers:  No major barriers noted today  Clinical Social Work Clinical Goal(s):  No clinical social work goals at this time  Interventions: Discussed common feeling and emotions when being diagnosed with cancer, and the importance of support during treatment Informed patient of the  support team roles and support services at [facility] Provided CSW contact information and encouraged patient to call with any questions or concerns   Follow Up Plan: Patient will contact CSW with any support or resource needs Patient verbalizes understanding of plan: Yes    Evia Goldsmith E Teven Mittman, LCSW Clinical Social Worker [facility]

## 2022-10-19 ENCOUNTER — Other Ambulatory Visit: Payer: Self-pay | Admitting: Oncology

## 2022-10-19 DIAGNOSIS — C20 Malignant neoplasm of rectum: Secondary | ICD-10-CM

## 2022-10-21 ENCOUNTER — Telehealth: Payer: Self-pay | Admitting: *Deleted

## 2022-10-21 NOTE — Telephone Encounter (Signed)
Clearance request was faxed back to our office today. I s/w the requesting office who said pt was now ready to have procedure. I did say that I would re-fax the notes in the chart ok for pt to hold coumadin for upcoming procedure.

## 2022-11-13 NOTE — Progress Notes (Signed)
[facility]  [address] [place],  [place]  [address] [phone]  Clinic Day:  11/14/2022  Referring physician: Hadley Pen, MD   HISTORY OF PRESENT ILLNESS:  The patient is an 82 y.o. male with stage IIa (T3 N0 M0) rectal cancer.  At his last visit, she was given explained work as to how treatment would theoretically be given to ultimately cure him of his disease.  Ultimately, it was up to him to determine whether or not he wants to go through treatment at this point in his life.  The patient has decided to undergo therapy.  In fact, he is already undergone port placement in preparation for upcoming chemotherapy.  As a pertains to his rectal cancer, he still has intermittent rectal bleeding, but it has not been as prominent as he has been in the past.     PHYSICAL EXAM:  Blood pressure 126/68, pulse 97, temperature 98.2 F (36.8 C), resp. rate 18, height 6\' 2"  (1.88 m), weight 165 lb 4.8 oz (75 kg), SpO2 96%. Wt Readings from Last 3 Encounters:  11/14/22 165 lb 4.8 oz (75 kg)  10/17/22 164 lb (74.4 kg)  02/13/22 170 lb (77.1 kg)   Body mass index is 21.22 kg/m. Performance status (ECOG): 1 - Symptomatic but completely ambulatory Physical Exam Constitutional:      Appearance: Normal appearance. He is not ill-appearing.  HENT:     Mouth/Throat:     Mouth: Mucous membranes are moist.     Pharynx: Oropharynx is clear. No oropharyngeal exudate or posterior oropharyngeal erythema.  Cardiovascular:     Rate and Rhythm: Normal rate and regular rhythm.     Heart sounds: No murmur heard.    No friction rub. No gallop.  Pulmonary:     Effort: Pulmonary effort is normal. No respiratory distress.     Breath sounds: Normal breath sounds. No wheezing, rhonchi or rales.  Abdominal:     General: Bowel sounds are normal. There is no distension.     Palpations: Abdomen is soft. There is no mass.     Tenderness: There is no abdominal tenderness.   Musculoskeletal:        General: No swelling.     Right lower leg: No edema.     Left lower leg: No edema.  Lymphadenopathy:     Cervical: No cervical adenopathy.     Upper Body:     Right upper body: No supraclavicular or axillary adenopathy.     Left upper body: No supraclavicular or axillary adenopathy.     Lower Body: No right inguinal adenopathy. No left inguinal adenopathy.  Skin:    General: Skin is warm.     Coloration: Skin is not jaundiced.     Findings: No lesion or rash.  Neurological:     General: No focal deficit present.     Mental Status: He is alert and oriented to person, place, and time. Mental status is at baseline.  Psychiatric:        Mood and Affect: Mood normal.        Behavior: Behavior normal.        Thought Content: Thought content normal.    LABS:      Latest Ref Rng & Units 11/14/2022    2:35 PM 10/17/2022   12:00 AM 10/09/2022   11:31 AM  CBC  WBC 4.0 - 10.5 K/uL 8.4  8.0     6.8   Hemoglobin 13.0 -  17.0 g/dL 62.1  30.8     65.7   Hematocrit 39.0 - 52.0 % 40.4  41     38.4   Platelets 150 - 400 K/uL 211  223     218      This result is from an external source.      Latest Ref Rng & Units 11/14/2022    2:35 PM 10/17/2022    3:15 PM 10/28/2019   11:35 AM  CMP  Glucose 70 - 99 mg/dL 846  90  83   BUN 8 - 23 mg/dL 23  20  23    Creatinine 0.61 - 1.24 mg/dL 9.62  9.52  8.41   Sodium 135 - 145 mmol/L 135  135  142   Potassium 3.5 - 5.1 mmol/L 4.4  4.3  4.6   Chloride 98 - 111 mmol/L 103  103  105   CO2 22 - 32 mmol/L 25  27  28    Calcium 8.9 - 10.3 mg/dL 9.0  8.9  9.1   Total Protein 6.5 - 8.1 g/dL 7.0  7.1  6.6   Total Bilirubin 0.3 - 1.2 mg/dL 0.4  0.4  0.4   Alkaline Phos 38 - 126 U/L 62  59  70   AST 15 - 41 U/L 22  19  24    ALT 0 - 44 U/L 18  17  23      ASSESSMENT & PLAN:  An 82 y.o. 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  As mentioned previously, this gentleman's port has already been placed in preparation for upcoming  neoadjuvant chemotherapy.  The first phase of his definitive therapy will consist of concurrent chemoradiation, with the chemotherapy agent being infusional 5-fluorouracil.  He will receive both over a span of 5+ weeks.  This is being done first to expedite the cessation of his rectal bleeding.  We will make sure he sees radiation oncology next week so that his rectal cancer radiation treatment plan can commence.  I anticipate his chemoradiation to commence within the next 2-3 weeks.  I will tentatively see this patient back in 1 month for repeat clinical assessment.  The patient understands all the plans discussed today and is in agreement with them.  Aprile Dickenson Kirby Funk, MD

## 2022-11-14 ENCOUNTER — Inpatient Hospital Stay: Payer: Medicare Other | Attending: Oncology

## 2022-11-14 ENCOUNTER — Inpatient Hospital Stay: Payer: Medicare Other | Admitting: Oncology

## 2022-11-14 VITALS — BP 126/68 | HR 97 | Temp 98.2°F | Resp 18 | Ht 74.0 in | Wt 165.3 lb

## 2022-11-14 DIAGNOSIS — Z5111 Encounter for antineoplastic chemotherapy: Secondary | ICD-10-CM | POA: Diagnosis present

## 2022-11-14 DIAGNOSIS — C20 Malignant neoplasm of rectum: Secondary | ICD-10-CM | POA: Insufficient documentation

## 2022-11-14 LAB — CBC WITH DIFFERENTIAL (CANCER CENTER ONLY)
Abs Immature Granulocytes: 0.03 10*3/uL (ref 0.00–0.07)
Basophils Absolute: 0.1 10*3/uL (ref 0.0–0.1)
Basophils Relative: 1 %
Eosinophils Absolute: 0.4 10*3/uL (ref 0.0–0.5)
Eosinophils Relative: 5 %
HCT: 40.4 % (ref 39.0–52.0)
Hemoglobin: 13 g/dL (ref 13.0–17.0)
Immature Granulocytes: 0 %
Lymphocytes Relative: 17 %
Lymphs Abs: 1.5 10*3/uL (ref 0.7–4.0)
MCH: 30.2 pg (ref 26.0–34.0)
MCHC: 32.2 g/dL (ref 30.0–36.0)
MCV: 94 fL (ref 80.0–100.0)
Monocytes Absolute: 0.8 10*3/uL (ref 0.1–1.0)
Monocytes Relative: 10 %
Neutro Abs: 5.6 10*3/uL (ref 1.7–7.7)
Neutrophils Relative %: 67 %
Platelet Count: 211 10*3/uL (ref 150–400)
RBC: 4.3 MIL/uL (ref 4.22–5.81)
RDW: 14.5 % (ref 11.5–15.5)
WBC Count: 8.4 10*3/uL (ref 4.0–10.5)
nRBC: 0 % (ref 0.0–0.2)

## 2022-11-14 LAB — CMP (CANCER CENTER ONLY)
ALT: 18 U/L (ref 0–44)
AST: 22 U/L (ref 15–41)
Albumin: 3.5 g/dL (ref 3.5–5.0)
Alkaline Phosphatase: 62 U/L (ref 38–126)
Anion gap: 7 (ref 5–15)
BUN: 23 mg/dL (ref 8–23)
CO2: 25 mmol/L (ref 22–32)
Calcium: 9 mg/dL (ref 8.9–10.3)
Chloride: 103 mmol/L (ref 98–111)
Creatinine: 0.92 mg/dL (ref 0.61–1.24)
GFR, Estimated: >60 mL/min (ref >60)
Glucose, Bld: 115 mg/dL — ABNORMAL HIGH (ref 70–99)
Potassium: 4.4 mmol/L (ref 3.5–5.1)
Sodium: 135 mmol/L (ref 135–145)
Total Bilirubin: 0.4 mg/dL (ref 0.3–1.2)
Total Protein: 7 g/dL (ref 6.5–8.1)

## 2022-11-17 ENCOUNTER — Other Ambulatory Visit: Payer: Self-pay | Admitting: Oncology

## 2022-11-17 DIAGNOSIS — C20 Malignant neoplasm of rectum: Secondary | ICD-10-CM

## 2022-11-20 ENCOUNTER — Encounter: Payer: Self-pay | Admitting: Oncology

## 2022-11-20 ENCOUNTER — Other Ambulatory Visit: Payer: Self-pay | Admitting: Oncology

## 2022-11-20 DIAGNOSIS — C20 Malignant neoplasm of rectum: Secondary | ICD-10-CM

## 2022-11-20 DIAGNOSIS — K1379 Other lesions of oral mucosa: Secondary | ICD-10-CM

## 2022-11-21 ENCOUNTER — Other Ambulatory Visit: Payer: Self-pay

## 2022-11-22 NOTE — Progress Notes (Unsigned)
[facility] CARE CLINIC CONSULT NOTE [facility] [place] [phone]   [phone]   Patient Care Team: Hadley Pen, MD as PCP - General (Family Medicine) Marylynn Pearson, MD (Family Medicine)   Name of the patient: Lee Strong  [id number]  Jan 07, 1941   Date of visit: 11/25/22  Diagnosis:  Rectal cancer  Chief complaint/Reason for visit- Initial Meeting for [facility], preparing for starting chemotherapy  Heme/Onc history:  Oncology History  Malignant neoplasm of rectum (HCC)  10/17/2022 Initial Diagnosis   Rectal cancer (HCC)   10/17/2022 Cancer Staging   Staging form: Colon and Rectum, AJCC 8th Edition - Clinical stage from 10/17/2022: Stage IIA (cT3, cN0, cM0) - Signed by Weston Settle, MD on 10/17/2022 Histopathologic type: Adenocarcinoma, NOS Stage prefix: Initial diagnosis Total positive nodes: 0   11/26/2022 -  Chemotherapy   Patient is on Treatment Plan : RECTUM 5FU IVCI D1-7 (225) + XRT        Interval history-  The patient presents to chemo care clinic today for initial meeting in preparation for starting chemotherapy. I introduced the chemo care clinic and we discussed that the role of the clinic is to assist those who are at an increased risk of emergency room visits and/or complications during the course of chemotherapy treatment. We discussed that the increased risk takes into account factors such as age, performance status, and co-morbidities. We also discussed that for some, this might include barriers to care such as not having a primary care provider, lack of insurance/transportation, or not being able to afford medications. We discussed that the goal of the program is to help prevent unplanned ER visits and help reduce complications during chemotherapy. We do this by discussing specific risk factors to each individual and identifying ways that we can help improve these risk factors and reduce barriers to care.    Allergies  Allergen Reactions   Penicillins Swelling and Other (See Comments)    Has patient had a PCN reaction causing immediate rash, facial/tongue/throat swelling, SOB or lightheadedness with hypotension: Unknown Has patient had a PCN reaction causing severe rash involving mucus membranes or skin necrosis: Unknown Has patient had a PCN reaction that required hospitalization: Unknown Has patient had a PCN reaction occurring within the last 10 years: No If all of the above answers are "NO", then may proceed with Cephalosporin use.     Past Medical History:  Diagnosis Date   Atrial fibrillation (HCC) 06/17/2017   Chronic atrial fibrillation (HCC) 12/20/2014   Overview:  .CHADS2 vasc score=2   Complication of anesthesia    Essential hypertension 12/20/2014   Hyperlipidemia 06/17/2017   Hypertension 06/17/2017   Long term (current) use of anticoagulants 12/20/2014   PONV (postoperative nausea and vomiting)    Prostate cancer (HCC)    Short of breath on exertion 06/17/2017   Weakness 06/17/2017    Past Surgical History:  Procedure Laterality Date   EXTERNAL AUDITORY CANAL RECONSTRUCTION Left    HERNIA REPAIR Bilateral    INGUINAL   PROSTATECTOMY     TONSILLECTOMY AND ADENOIDECTOMY     TOTAL KNEE ARTHROPLASTY Left 06/29/2018   Procedure: TOTAL KNEE ARTHROPLASTY;  Surgeon: Dannielle Huh, MD;  Location: [facility];  Service: Orthopedics;  Laterality: Left;    Social History   Socioeconomic History   Marital status: Widowed    Spouse name: Not on file   Number of children: 1   Years of education: 12   Highest education level: Not  on file  Occupational History   Occupation: RETIRED SUPERVISOR WIT [employer]  Tobacco Use   Smoking status: Never   Smokeless tobacco: Former  Building services engineer status: Never Used  Substance and Sexual Activity   Alcohol use: No   Drug use: No   Sexual activity: Not Currently  Other Topics Concern   Not on file  Social History  Narrative   ** Merged History Encounter **       Social Determinants of Health   Financial Resource Strain: Not on file  Food Insecurity: No Food Insecurity (10/17/2022)   Hunger Vital Sign    Worried About Running Out of Food in the Last Year: Never true    Ran Out of Food in the Last Year: Never true  Transportation Needs: No Transportation Needs (10/17/2022)   PRAPARE - Administrator, Civil Service (Medical): No    Lack of Transportation (Non-Medical): No  Physical Activity: Not on file  Stress: Not on file  Social Connections: Not on file  Intimate Partner Violence: Not At Risk (10/17/2022)   Humiliation, Afraid, Rape, and Kick questionnaire    Fear of Current or Ex-Partner: No    Emotionally Abused: No    Physically Abused: No    Sexually Abused: No    Family History  Problem Relation Age of Onset   Stroke Mother    Hypertension Mother    Heart attack Father    Hypertension Father    Prostate cancer Brother    Crohn's disease Brother    Colon cancer Brother      Current Outpatient Medications:    furosemide (LASIX) 40 MG tablet, Take 1 tablet by mouth daily., Disp: , Rfl:    metoprolol succinate (TOPROL-XL) 50 MG 24 hr tablet, Take 1 tablet (50 mg total) by mouth 2 (two) times daily., Disp: 180 tablet, Rfl: 3   ondansetron (ZOFRAN-ODT) 4 MG disintegrating tablet, Take 4 mg by mouth every 6 (six) hours as needed. (Patient not taking: Reported on 11/25/2022), Disp: , Rfl:    oxybutynin (DITROPAN-XL) 10 MG 24 hr tablet, Take 10 mg by mouth daily., Disp: , Rfl:    oxyCODONE (OXY IR/ROXICODONE) 5 MG immediate release tablet, Take 5 mg by mouth every 6 (six) hours as needed. (Patient not taking: Reported on 11/25/2022), Disp: , Rfl:    spironolactone (ALDACTONE) 25 MG tablet, TAKE 1/2 TABLET BY MOUTH ONCE DAILY., Disp: 45 tablet, Rfl: 8   vitamin C (ASCORBIC ACID) 500 MG tablet, Take 500 mg by mouth daily., Disp: , Rfl:    VITAMIN D PO, Take 500 Units by mouth  daily., Disp: , Rfl:    warfarin (COUMADIN) 4 MG tablet, Take 4 mg by mouth daily. , Disp: , Rfl:      Latest Ref Rng & Units 11/14/2022    2:35 PM  CMP  Glucose 70 - 99 mg/dL 956   BUN 8 - 23 mg/dL 23   Creatinine 3.87 - 1.24 mg/dL 5.64   Sodium 332 - 951 mmol/L 135   Potassium 3.5 - 5.1 mmol/L 4.4   Chloride 98 - 111 mmol/L 103   CO2 22 - 32 mmol/L 25   Calcium 8.9 - 10.3 mg/dL 9.0   Total Protein 6.5 - 8.1 g/dL 7.0   Total Bilirubin 0.3 - 1.2 mg/dL 0.4   Alkaline Phos 38 - 126 U/L 62   AST 15 - 41 U/L 22   ALT 0 - 44 U/L 18  Latest Ref Rng & Units 11/14/2022    2:35 PM  CBC  WBC 4.0 - 10.5 K/uL 8.4   Hemoglobin 13.0 - 17.0 g/dL 16.1   Hematocrit 09.6 - 52.0 % 40.4   Platelets 150 - 400 K/uL 211     No images are attached to the encounter.  No results found.   Assessment and plan-  The patient is a 82 y.o. male who presents to [facility] for initial meeting in preparation for starting chemotherapy for the treatment of  1. Rectal cancer (HCC)   .   Chemo Care Clinic/High Risk for ER/Hospitalization during chemotherapy- We discussed the role of the chemo care clinic and identified patient specific risk factors. I discussed that patient was identified as high risk primarily based on:  Patient has past medical history positive for: Past Medical History:  Diagnosis Date   Atrial fibrillation (HCC) 06/17/2017   Chronic atrial fibrillation (HCC) 12/20/2014   Overview:  .CHADS2 vasc score=2   Complication of anesthesia    Essential hypertension 12/20/2014   Hyperlipidemia 06/17/2017   Hypertension 06/17/2017   Long term (current) use of anticoagulants 12/20/2014   PONV (postoperative nausea and vomiting)    Prostate cancer (HCC)    Short of breath on exertion 06/17/2017   Weakness 06/17/2017    Patient has past surgical history positive for: Past Surgical History:  Procedure Laterality Date   EXTERNAL AUDITORY CANAL RECONSTRUCTION Left    HERNIA REPAIR  Bilateral    INGUINAL   PROSTATECTOMY     TONSILLECTOMY AND ADENOIDECTOMY     TOTAL KNEE ARTHROPLASTY Left 06/29/2018   Procedure: TOTAL KNEE ARTHROPLASTY;  Surgeon: Dannielle Huh, MD;  Location: [facility];  Service: Orthopedics;  Laterality: Left;    Provided general information including the following:  1.  Date of education: 11/25/2022 2.  Physician name: Dr. Rennis Harding 3.  Diagnosis: Rectal cancer 4.  Stage: IIA 5.  Cure  6.  Chemotherapy plan including drugs and how often: 5-fluorouracil continuous infusion with radiation 7.  Start date: 11/26/2022 8.  Other referrals: None at this time 9.  The patient is to call our office with any questions or concerns.  Our office number [phone], if after hours or on the weekend, call the same number and wait for the answering service.  There is always an oncologist on call. 10.  Medications prescribed: ondansetron, prochlorperazine 11.  The patient has verbalized understanding of the treatment plan and has no barriers to adherence or understanding.   Obtained signed consent from patient.   Discussed symptoms including:  1.  Low blood counts including white blood cells red blood cells, and platelets.  If experience increased fatigue or abnormal bruising or bleeding, call our office. 2.  Infection including to avoid large crowds, wash hands frequently, and stay away from people who were sick.  If fever develops of 100.4 or higher, call our office. 3.  Mucositis:  Instructions on mouth rinse given (baking soda and salt mixture).  Keep mouth clean.  Use soft bristle toothbrush.  Avoid alcohol containing mouthwash.  If mouth sores develop, call our office. 4.  Nausea/vomiting:  Prescriptions given: ondansetron 8 mg every 8 hours as needed for nausea or vomiting and prochlorperazine 10 mg every 6 hours as needed for nausea or vomiting, may alternate these medications and take around the clock if persistent.  If nausea and vomiting is not  controlled, call our office 5.  Diarrhea: Use over-the-counter Imodium.  Call our office  if diarrhea is not controlled. 6.  Constipation: Use senna-S, 1 to 2 tablets twice a day.  Call our office if no BM in 2 to 3 days. 7.  Loss of appetite:  Try to eat small meals every 2-3 hours.  Call our office if not able to eat or drink. 8.  Taste changes:  Try zinc 50 mg daily.  If becomes severe call clinic. 9.  Drink 2 to 3 quarts of water per day. Call our office if not able to drink enough for urine to be pale yellow. 10. Avoid alcoholic beverages. 11. Peripheral neuropathy: Call office if numbness or tingling in hands or feet worsens or is suddenly severe. 12.  Ringing in the ears or hearing loss.  Call our office if this develops.    The patient was given written information printed from Elsevier patient education on individual chemotherapy agents which includes: Name of medications Approved uses Dose and schedule Storage and handling Handling body fluids and waste Drug and food interactions Possible side effects and management Pregnancy, sexual activity, and contraception Obtaining medication   Gave information on the supportive care team and how to contact them regarding services.  Discussed advanced directives.  The patient does not have their advanced directives.   We discussed that social determinants of health may have significant impacts on health and outcomes for cancer patients.  Today we discussed specific social determinants of performance status, alcohol use, depression, financial needs, food insecurity, housing, interpersonal violence, social connections, stress, tobacco use, and transportation.    After lengthy discussion the following were identified as areas of need:   Outpatient services: We discussed options including home based and outpatient services, DME, nutrition counseling, and supportive care program. We discussed that patients who participate in regular physical  activity report fewer negative impacts of cancer and treatments and report less fatigue.   Financial Concerns: We discussed that living with cancer can create tremendous financial burden.  We discussed options for assistance. I asked that if assistance is needed in affording medications or paying bills to please let us know so that we can provide assistance. We discussed options for food including social services.  Referral to Social work: Introduced Child psychotherapist Mady Haagensen and the services she can provide, such as support with utility bill, cell phone and gas vouchers.    Support groups: We discussed options for support groups at [facility]. We discussed options for managing stress including healthy eating and exercise, as well as participating in no charge counseling services at the cancer center and support groups.  If these are of interest, patient can notify either myself or primary nursing team.We discussed options for management including medications.  Transportation: We discussed options for transportation.  The patient will contact our office if he requires assistance with transportation.  Palliative care services: We have palliative care services available in the cancer center to discuss goals of care and advanced care planning.  Please let us know if you have any questions or would like to speak to our palliative care practitioner.  Symptom Management Clinic: We discussed our symptom management clinic which is available for acute concerns while receiving treatment such as nausea, vomiting or diarrhea.  We can be reached via telephone at [phone].  We are available for virtual or in person visits on the same day from 9 to 4 PM Monday through Friday.   He denies needing specific assistance at this time.  He will be followed by Dr. Theadore Nan clinical team.  Disposition: RTC on 12/13/2022  Visit Diagnosis 1. Rectal cancer [facility])     I discussed the assessment and treatment plan  with the patient and his daughter, April.  The patient was provided an opportunity to ask questions and all were answered. The patient expressed understanding and was in agreement with this plan. He also understands that he can call clinic at any time with any questions, concerns, or complaints.   I provided 30 minutes of face-to-face time during this encounter, and > 50% was spent counseling as documented under my assessment & plan.   Savvas Roper A. Sol Passer, PA-C [facility] [phone]

## 2022-11-25 ENCOUNTER — Inpatient Hospital Stay (INDEPENDENT_AMBULATORY_CARE_PROVIDER_SITE_OTHER): Payer: Medicare Other | Admitting: Hematology and Oncology

## 2022-11-25 ENCOUNTER — Encounter: Payer: Self-pay | Admitting: Hematology and Oncology

## 2022-11-25 ENCOUNTER — Encounter: Payer: Self-pay | Admitting: Oncology

## 2022-11-25 ENCOUNTER — Inpatient Hospital Stay: Payer: Medicare Other

## 2022-11-25 VITALS — BP 109/81 | HR 89 | Temp 97.6°F | Resp 18 | Ht 74.0 in | Wt 165.0 lb

## 2022-11-25 DIAGNOSIS — C20 Malignant neoplasm of rectum: Secondary | ICD-10-CM | POA: Diagnosis not present

## 2022-11-25 LAB — CBC AND DIFFERENTIAL
HCT: 40 — AB (ref 41–53)
Hemoglobin: 13.4 — AB (ref 13.5–17.5)
Neutrophils Absolute: 5.44
Platelets: 215 10*3/uL (ref 150–400)
WBC: 8

## 2022-11-25 LAB — BASIC METABOLIC PANEL
BUN: 23 — AB (ref 4–21)
CO2: 24 — AB (ref 13–22)
Chloride: 104 (ref 99–108)
Creatinine: 0.8 (ref 0.6–1.3)
Glucose: 103
Potassium: 4.3 mEq/L (ref 3.5–5.1)
Sodium: 135 — AB (ref 137–147)

## 2022-11-25 LAB — HEPATIC FUNCTION PANEL
ALT: 22 U/L (ref 10–40)
AST: 32 (ref 14–40)
Alkaline Phosphatase: 65 (ref 25–125)
Bilirubin, Total: 0.7

## 2022-11-25 LAB — COMPREHENSIVE METABOLIC PANEL
Albumin: 4 (ref 3.5–5.0)
Calcium: 9.4 (ref 8.7–10.7)

## 2022-11-25 LAB — CBC: RBC: 4.42 (ref 3.87–5.11)

## 2022-11-26 ENCOUNTER — Inpatient Hospital Stay: Payer: Medicare Other

## 2022-11-26 VITALS — BP 118/70 | HR 91 | Temp 97.0°F | Resp 18

## 2022-11-26 DIAGNOSIS — C20 Malignant neoplasm of rectum: Secondary | ICD-10-CM

## 2022-11-26 DIAGNOSIS — Z5111 Encounter for antineoplastic chemotherapy: Secondary | ICD-10-CM | POA: Diagnosis not present

## 2022-11-26 MED ORDER — PROCHLORPERAZINE MALEATE 10 MG PO TABS
10.0000 mg | ORAL_TABLET | Freq: Four times a day (QID) | ORAL | 1 refills | Status: DC | PRN
Start: 2022-11-26 — End: 2023-01-03

## 2022-11-26 MED ORDER — ONDANSETRON HCL 8 MG PO TABS
8.0000 mg | ORAL_TABLET | Freq: Three times a day (TID) | ORAL | 1 refills | Status: DC | PRN
Start: 2022-11-26 — End: 2023-01-03

## 2022-11-26 MED ORDER — SODIUM CHLORIDE 0.9 % IV SOLN
225.0000 mg/m2/d | INTRAVENOUS | Status: DC
  Administered 2022-11-26: 3100 mg via INTRAVENOUS
  Filled 2022-11-26: qty 62

## 2022-11-26 NOTE — Patient Instructions (Signed)
Fluorouracil Injection What is this medication? FLUOROURACIL (flure oh YOOR a sil) treats some types of cancer. It works by slowing down the growth of cancer cells. This medicine may be used for other purposes; ask your health care provider or pharmacist if you have questions. COMMON BRAND NAME(S): Adrucil What should I tell my care team before I take this medication? They need to know if you have any of these conditions: Blood disorders Dihydropyrimidine dehydrogenase (DPD) deficiency Infection, such as chickenpox, cold sores, herpes Kidney disease Liver disease Poor nutrition Recent or ongoing radiation therapy An unusual or allergic reaction to fluorouracil, other medications, foods, dyes, or preservatives If you or your partner are pregnant or trying to get pregnant Breast-feeding How should I use this medication? This medication is injected into a vein. It is administered by your care team in a hospital or clinic setting. Talk to your care team about the use of this medication in children. Special care may be needed. Overdosage: If you think you have taken too much of this medicine contact a poison control center or emergency room at once. NOTE: This medicine is only for you. Do not share this medicine with others. What if I miss a dose? Keep appointments for follow-up doses. It is important not to miss your dose. Call your care team if you are unable to keep an appointment. What may interact with this medication? Do not take this medication with any of the following: Live virus vaccines This medication may also interact with the following: Medications that treat or prevent blood clots, such as warfarin, enoxaparin, dalteparin This list may not describe all possible interactions. Give your health care provider a list of all the medicines, herbs, non-prescription drugs, or dietary supplements you use. Also tell them if you smoke, drink alcohol, or use illegal drugs. Some items may  interact with your medicine. What should I watch for while using this medication? Your condition will be monitored carefully while you are receiving this medication. This medication may make you feel generally unwell. This is not uncommon as chemotherapy can affect healthy cells as well as cancer cells. Report any side effects. Continue your course of treatment even though you feel ill unless your care team tells you to stop. In some cases, you may be given additional medications to help with side effects. Follow all directions for their use. This medication may increase your risk of getting an infection. Call your care team for advice if you get a fever, chills, sore throat, or other symptoms of a cold or flu. Do not treat yourself. Try to avoid being around people who are sick. This medication may increase your risk to bruise or bleed. Call your care team if you notice any unusual bleeding. Be careful brushing or flossing your teeth or using a toothpick because you may get an infection or bleed more easily. If you have any dental work done, tell your dentist you are receiving this medication. Avoid taking medications that contain aspirin, acetaminophen, ibuprofen, naproxen, or ketoprofen unless instructed by your care team. These medications may hide a fever. Do not treat diarrhea with over the counter products. Contact your care team if you have diarrhea that lasts more than 2 days or if it is severe and watery. This medication can make you more sensitive to the sun. Keep out of the sun. If you cannot avoid being in the sun, wear protective clothing and sunscreen. Do not use sun lamps, tanning beds, or tanning booths. Talk to   your care team if you or your partner wish to become pregnant or think you might be pregnant. This medication can cause serious birth defects if taken during pregnancy and for 3 months after the last dose. A reliable form of contraception is recommended while taking this  medication and for 3 months after the last dose. Talk to your care team about effective forms of contraception. Do not father a child while taking this medication and for 3 months after the last dose. Use a condom while having sex during this time period. Do not breastfeed while taking this medication. This medication may cause infertility. Talk to your care team if you are concerned about your fertility. What side effects may I notice from receiving this medication? Side effects that you should report to your care team as soon as possible: Allergic reactions--skin rash, itching, hives, swelling of the face, lips, tongue, or throat Heart attack--pain or tightness in the chest, shoulders, arms, or jaw, nausea, shortness of breath, cold or clammy skin, feeling faint or lightheaded Heart failure--shortness of breath, swelling of the ankles, feet, or hands, sudden weight gain, unusual weakness or fatigue Heart rhythm changes--fast or irregular heartbeat, dizziness, feeling faint or lightheaded, chest pain, trouble breathing High ammonia level--unusual weakness or fatigue, confusion, loss of appetite, nausea, vomiting, seizures Infection--fever, chills, cough, sore throat, wounds that don't heal, pain or trouble when passing urine, general feeling of discomfort or being unwell Low red blood cell level--unusual weakness or fatigue, dizziness, headache, trouble breathing Pain, tingling, or numbness in the hands or feet, muscle weakness, change in vision, confusion or trouble speaking, loss of balance or coordination, trouble walking, seizures Redness, swelling, and blistering of the skin over hands and feet Severe or prolonged diarrhea Unusual bruising or bleeding Side effects that usually do not require medical attention (report to your care team if they continue or are bothersome): Dry skin Headache Increased tears Nausea Pain, redness, or swelling with sores inside the mouth or throat Sensitivity  to light Vomiting This list may not describe all possible side effects. Call your doctor for medical advice about side effects. You may report side effects to FDA at 1-800-FDA-1088. Where should I keep my medication? This medication is given in a hospital or clinic. It will not be stored at home. NOTE: This sheet is a summary. It may not cover all possible information. If you have questions about this medicine, talk to your doctor, pharmacist, or health care provider.  2024 Elsevier/Gold Standard (2021-08-28 00:00:00)  

## 2022-11-29 ENCOUNTER — Inpatient Hospital Stay: Payer: Medicare Other

## 2022-11-29 ENCOUNTER — Telehealth: Payer: Self-pay

## 2022-11-29 DIAGNOSIS — Z5111 Encounter for antineoplastic chemotherapy: Secondary | ICD-10-CM | POA: Diagnosis not present

## 2022-11-29 DIAGNOSIS — C20 Malignant neoplasm of rectum: Secondary | ICD-10-CM

## 2022-11-29 LAB — CBC WITH DIFFERENTIAL (CANCER CENTER ONLY)
Abs Immature Granulocytes: 0.02 10*3/uL (ref 0.00–0.07)
Basophils Absolute: 0 10*3/uL (ref 0.0–0.1)
Basophils Relative: 1 %
Eosinophils Absolute: 0.3 10*3/uL (ref 0.0–0.5)
Eosinophils Relative: 4 %
HCT: 39.7 % (ref 39.0–52.0)
Hemoglobin: 12.8 g/dL — ABNORMAL LOW (ref 13.0–17.0)
Immature Granulocytes: 0 %
Lymphocytes Relative: 11 %
Lymphs Abs: 0.9 10*3/uL (ref 0.7–4.0)
MCH: 30 pg (ref 26.0–34.0)
MCHC: 32.2 g/dL (ref 30.0–36.0)
MCV: 93 fL (ref 80.0–100.0)
Monocytes Absolute: 0.8 10*3/uL (ref 0.1–1.0)
Monocytes Relative: 10 %
Neutro Abs: 6.2 10*3/uL (ref 1.7–7.7)
Neutrophils Relative %: 74 %
Platelet Count: 199 10*3/uL (ref 150–400)
RBC: 4.27 MIL/uL (ref 4.22–5.81)
RDW: 14.6 % (ref 11.5–15.5)
WBC Count: 8.3 10*3/uL (ref 4.0–10.5)
nRBC: 0 % (ref 0.0–0.2)

## 2022-11-29 LAB — CMP (CANCER CENTER ONLY)
ALT: 19 U/L (ref 0–44)
AST: 22 U/L (ref 15–41)
Albumin: 3.7 g/dL (ref 3.5–5.0)
Alkaline Phosphatase: 59 U/L (ref 38–126)
Anion gap: 7 (ref 5–15)
BUN: 21 mg/dL (ref 8–23)
CO2: 25 mmol/L (ref 22–32)
Calcium: 9 mg/dL (ref 8.9–10.3)
Chloride: 103 mmol/L (ref 98–111)
Creatinine: 0.84 mg/dL (ref 0.61–1.24)
GFR, Estimated: >60 mL/min (ref >60)
Glucose, Bld: 92 mg/dL (ref 70–99)
Potassium: 4.4 mmol/L (ref 3.5–5.1)
Sodium: 135 mmol/L (ref 135–145)
Total Bilirubin: 0.9 mg/dL (ref 0.3–1.2)
Total Protein: 6.9 g/dL (ref 6.5–8.1)

## 2022-11-29 NOTE — Telephone Encounter (Signed)
ATTEMPTED TO CALL PT AND DAUGHTER ON [facility] 7/24 AND THURSDAY 7/25 WITH NO ANSWER

## 2022-12-03 ENCOUNTER — Inpatient Hospital Stay: Payer: Medicare Other

## 2022-12-03 VITALS — BP 119/74 | HR 85 | Temp 97.6°F | Resp 18 | Ht 74.0 in | Wt 164.1 lb

## 2022-12-03 DIAGNOSIS — C20 Malignant neoplasm of rectum: Secondary | ICD-10-CM

## 2022-12-03 DIAGNOSIS — Z5111 Encounter for antineoplastic chemotherapy: Secondary | ICD-10-CM | POA: Diagnosis not present

## 2022-12-03 MED ORDER — SODIUM CHLORIDE 0.9 % IV SOLN
225.0000 mg/m2/d | INTRAVENOUS | Status: DC
  Administered 2022-12-03: 3100 mg via INTRAVENOUS
  Filled 2022-12-03: qty 62

## 2022-12-03 MED ORDER — HEPARIN SOD (PORK) LOCK FLUSH 100 UNIT/ML IV SOLN
500.0000 [IU] | Freq: Once | INTRAVENOUS | Status: AC | PRN
  Administered 2022-12-03: 500 [IU]

## 2022-12-03 MED ORDER — SODIUM CHLORIDE 0.9% FLUSH
10.0000 mL | INTRAVENOUS | Status: DC | PRN
  Administered 2022-12-03: 10 mL

## 2022-12-03 NOTE — Patient Instructions (Signed)
Fluorouracil Injection What is this medication? FLUOROURACIL (flure oh YOOR a sil) treats some types of cancer. It works by slowing down the growth of cancer cells. This medicine may be used for other purposes; ask your health care provider or pharmacist if you have questions. COMMON BRAND NAME(S): Adrucil What should I tell my care team before I take this medication? They need to know if you have any of these conditions: Blood disorders Dihydropyrimidine dehydrogenase (DPD) deficiency Infection, such as chickenpox, cold sores, herpes Kidney disease Liver disease Poor nutrition Recent or ongoing radiation therapy An unusual or allergic reaction to fluorouracil, other medications, foods, dyes, or preservatives If you or your partner are pregnant or trying to get pregnant Breast-feeding How should I use this medication? This medication is injected into a vein. It is administered by your care team in a hospital or clinic setting. Talk to your care team about the use of this medication in children. Special care may be needed. Overdosage: If you think you have taken too much of this medicine contact a poison control center or emergency room at once. NOTE: This medicine is only for you. Do not share this medicine with others. What if I miss a dose? Keep appointments for follow-up doses. It is important not to miss your dose. Call your care team if you are unable to keep an appointment. What may interact with this medication? Do not take this medication with any of the following: Live virus vaccines This medication may also interact with the following: Medications that treat or prevent blood clots, such as warfarin, enoxaparin, dalteparin This list may not describe all possible interactions. Give your health care provider a list of all the medicines, herbs, non-prescription drugs, or dietary supplements you use. Also tell them if you smoke, drink alcohol, or use illegal drugs. Some items may  interact with your medicine. What should I watch for while using this medication? Your condition will be monitored carefully while you are receiving this medication. This medication may make you feel generally unwell. This is not uncommon as chemotherapy can affect healthy cells as well as cancer cells. Report any side effects. Continue your course of treatment even though you feel ill unless your care team tells you to stop. In some cases, you may be given additional medications to help with side effects. Follow all directions for their use. This medication may increase your risk of getting an infection. Call your care team for advice if you get a fever, chills, sore throat, or other symptoms of a cold or flu. Do not treat yourself. Try to avoid being around people who are sick. This medication may increase your risk to bruise or bleed. Call your care team if you notice any unusual bleeding. Be careful brushing or flossing your teeth or using a toothpick because you may get an infection or bleed more easily. If you have any dental work done, tell your dentist you are receiving this medication. Avoid taking medications that contain aspirin, acetaminophen, ibuprofen, naproxen, or ketoprofen unless instructed by your care team. These medications may hide a fever. Do not treat diarrhea with over the counter products. Contact your care team if you have diarrhea that lasts more than 2 days or if it is severe and watery. This medication can make you more sensitive to the sun. Keep out of the sun. If you cannot avoid being in the sun, wear protective clothing and sunscreen. Do not use sun lamps, tanning beds, or tanning booths. Talk to   your care team if you or your partner wish to become pregnant or think you might be pregnant. This medication can cause serious birth defects if taken during pregnancy and for 3 months after the last dose. A reliable form of contraception is recommended while taking this  medication and for 3 months after the last dose. Talk to your care team about effective forms of contraception. Do not father a child while taking this medication and for 3 months after the last dose. Use a condom while having sex during this time period. Do not breastfeed while taking this medication. This medication may cause infertility. Talk to your care team if you are concerned about your fertility. What side effects may I notice from receiving this medication? Side effects that you should report to your care team as soon as possible: Allergic reactions--skin rash, itching, hives, swelling of the face, lips, tongue, or throat Heart attack--pain or tightness in the chest, shoulders, arms, or jaw, nausea, shortness of breath, cold or clammy skin, feeling faint or lightheaded Heart failure--shortness of breath, swelling of the ankles, feet, or hands, sudden weight gain, unusual weakness or fatigue Heart rhythm changes--fast or irregular heartbeat, dizziness, feeling faint or lightheaded, chest pain, trouble breathing High ammonia level--unusual weakness or fatigue, confusion, loss of appetite, nausea, vomiting, seizures Infection--fever, chills, cough, sore throat, wounds that don't heal, pain or trouble when passing urine, general feeling of discomfort or being unwell Low red blood cell level--unusual weakness or fatigue, dizziness, headache, trouble breathing Pain, tingling, or numbness in the hands or feet, muscle weakness, change in vision, confusion or trouble speaking, loss of balance or coordination, trouble walking, seizures Redness, swelling, and blistering of the skin over hands and feet Severe or prolonged diarrhea Unusual bruising or bleeding Side effects that usually do not require medical attention (report to your care team if they continue or are bothersome): Dry skin Headache Increased tears Nausea Pain, redness, or swelling with sores inside the mouth or throat Sensitivity  to light Vomiting This list may not describe all possible side effects. Call your doctor for medical advice about side effects. You may report side effects to FDA at 1-800-FDA-1088. Where should I keep my medication? This medication is given in a hospital or clinic. It will not be stored at home. NOTE: This sheet is a summary. It may not cover all possible information. If you have questions about this medicine, talk to your doctor, pharmacist, or health care provider.  2024 Elsevier/Gold Standard (2021-08-28 00:00:00)  

## 2022-12-06 ENCOUNTER — Inpatient Hospital Stay: Payer: Medicare Other | Attending: Oncology

## 2022-12-06 DIAGNOSIS — C20 Malignant neoplasm of rectum: Secondary | ICD-10-CM | POA: Insufficient documentation

## 2022-12-06 DIAGNOSIS — Z5111 Encounter for antineoplastic chemotherapy: Secondary | ICD-10-CM | POA: Diagnosis present

## 2022-12-06 DIAGNOSIS — R197 Diarrhea, unspecified: Secondary | ICD-10-CM | POA: Insufficient documentation

## 2022-12-06 LAB — CMP (CANCER CENTER ONLY)
ALT: 20 U/L (ref 0–44)
AST: 22 U/L (ref 15–41)
Albumin: 3.8 g/dL (ref 3.5–5.0)
Alkaline Phosphatase: 60 U/L (ref 38–126)
Anion gap: 7 (ref 5–15)
BUN: 17 mg/dL (ref 8–23)
CO2: 24 mmol/L (ref 22–32)
Calcium: 9.1 mg/dL (ref 8.9–10.3)
Chloride: 104 mmol/L (ref 98–111)
Creatinine: 0.97 mg/dL (ref 0.61–1.24)
GFR, Estimated: >60 mL/min (ref >60)
Glucose, Bld: 94 mg/dL (ref 70–99)
Potassium: 4.4 mmol/L (ref 3.5–5.1)
Sodium: 135 mmol/L (ref 135–145)
Total Bilirubin: 0.8 mg/dL (ref 0.3–1.2)
Total Protein: 7 g/dL (ref 6.5–8.1)

## 2022-12-06 LAB — CBC WITH DIFFERENTIAL (CANCER CENTER ONLY)
Abs Immature Granulocytes: 0.01 10*3/uL (ref 0.00–0.07)
Basophils Absolute: 0 10*3/uL (ref 0.0–0.1)
Basophils Relative: 1 %
Eosinophils Absolute: 0.2 10*3/uL (ref 0.0–0.5)
Eosinophils Relative: 3 %
HCT: 38.4 % — ABNORMAL LOW (ref 39.0–52.0)
Hemoglobin: 12.6 g/dL — ABNORMAL LOW (ref 13.0–17.0)
Immature Granulocytes: 0 %
Lymphocytes Relative: 11 %
Lymphs Abs: 0.7 10*3/uL (ref 0.7–4.0)
MCH: 30.6 pg (ref 26.0–34.0)
MCHC: 32.8 g/dL (ref 30.0–36.0)
MCV: 93.2 fL (ref 80.0–100.0)
Monocytes Absolute: 0.7 10*3/uL (ref 0.1–1.0)
Monocytes Relative: 11 %
Neutro Abs: 4.6 10*3/uL (ref 1.7–7.7)
Neutrophils Relative %: 74 %
Platelet Count: 200 10*3/uL (ref 150–400)
RBC: 4.12 MIL/uL — ABNORMAL LOW (ref 4.22–5.81)
RDW: 15.2 % (ref 11.5–15.5)
WBC Count: 6.2 10*3/uL (ref 4.0–10.5)
nRBC: 0 % (ref 0.0–0.2)

## 2022-12-10 ENCOUNTER — Inpatient Hospital Stay: Payer: Medicare Other

## 2022-12-10 VITALS — BP 114/77 | HR 100 | Temp 97.7°F | Resp 18 | Ht 74.0 in | Wt 162.8 lb

## 2022-12-10 DIAGNOSIS — Z5111 Encounter for antineoplastic chemotherapy: Secondary | ICD-10-CM | POA: Diagnosis not present

## 2022-12-10 DIAGNOSIS — C20 Malignant neoplasm of rectum: Secondary | ICD-10-CM

## 2022-12-10 MED ORDER — SODIUM CHLORIDE 0.9 % IV SOLN
225.0000 mg/m2/d | INTRAVENOUS | Status: DC
  Administered 2022-12-10: 3100 mg via INTRAVENOUS
  Filled 2022-12-10: qty 62

## 2022-12-10 MED ORDER — LIDOCAINE VISCOUS HCL 2 % MT SOLN
5.0000 mL | Freq: Four times a day (QID) | ORAL | 1 refills | Status: DC | PRN
Start: 2022-12-10 — End: 2023-10-09

## 2022-12-10 NOTE — Patient Instructions (Signed)
Fluorouracil Injection What is this medication? FLUOROURACIL (flure oh YOOR a sil) treats some types of cancer. It works by slowing down the growth of cancer cells. This medicine may be used for other purposes; ask your health care provider or pharmacist if you have questions. COMMON BRAND NAME(S): Adrucil What should I tell my care team before I take this medication? They need to know if you have any of these conditions: Blood disorders Dihydropyrimidine dehydrogenase (DPD) deficiency Infection, such as chickenpox, cold sores, herpes Kidney disease Liver disease Poor nutrition Recent or ongoing radiation therapy An unusual or allergic reaction to fluorouracil, other medications, foods, dyes, or preservatives If you or your partner are pregnant or trying to get pregnant Breast-feeding How should I use this medication? This medication is injected into a vein. It is administered by your care team in a hospital or clinic setting. Talk to your care team about the use of this medication in children. Special care may be needed. Overdosage: If you think you have taken too much of this medicine contact a poison control center or emergency room at once. NOTE: This medicine is only for you. Do not share this medicine with others. What if I miss a dose? Keep appointments for follow-up doses. It is important not to miss your dose. Call your care team if you are unable to keep an appointment. What may interact with this medication? Do not take this medication with any of the following: Live virus vaccines This medication may also interact with the following: Medications that treat or prevent blood clots, such as warfarin, enoxaparin, dalteparin This list may not describe all possible interactions. Give your health care provider a list of all the medicines, herbs, non-prescription drugs, or dietary supplements you use. Also tell them if you smoke, drink alcohol, or use illegal drugs. Some items may  interact with your medicine. What should I watch for while using this medication? Your condition will be monitored carefully while you are receiving this medication. This medication may make you feel generally unwell. This is not uncommon as chemotherapy can affect healthy cells as well as cancer cells. Report any side effects. Continue your course of treatment even though you feel ill unless your care team tells you to stop. In some cases, you may be given additional medications to help with side effects. Follow all directions for their use. This medication may increase your risk of getting an infection. Call your care team for advice if you get a fever, chills, sore throat, or other symptoms of a cold or flu. Do not treat yourself. Try to avoid being around people who are sick. This medication may increase your risk to bruise or bleed. Call your care team if you notice any unusual bleeding. Be careful brushing or flossing your teeth or using a toothpick because you may get an infection or bleed more easily. If you have any dental work done, tell your dentist you are receiving this medication. Avoid taking medications that contain aspirin, acetaminophen, ibuprofen, naproxen, or ketoprofen unless instructed by your care team. These medications may hide a fever. Do not treat diarrhea with over the counter products. Contact your care team if you have diarrhea that lasts more than 2 days or if it is severe and watery. This medication can make you more sensitive to the sun. Keep out of the sun. If you cannot avoid being in the sun, wear protective clothing and sunscreen. Do not use sun lamps, tanning beds, or tanning booths. Talk to   your care team if you or your partner wish to become pregnant or think you might be pregnant. This medication can cause serious birth defects if taken during pregnancy and for 3 months after the last dose. A reliable form of contraception is recommended while taking this  medication and for 3 months after the last dose. Talk to your care team about effective forms of contraception. Do not father a child while taking this medication and for 3 months after the last dose. Use a condom while having sex during this time period. Do not breastfeed while taking this medication. This medication may cause infertility. Talk to your care team if you are concerned about your fertility. What side effects may I notice from receiving this medication? Side effects that you should report to your care team as soon as possible: Allergic reactions--skin rash, itching, hives, swelling of the face, lips, tongue, or throat Heart attack--pain or tightness in the chest, shoulders, arms, or jaw, nausea, shortness of breath, cold or clammy skin, feeling faint or lightheaded Heart failure--shortness of breath, swelling of the ankles, feet, or hands, sudden weight gain, unusual weakness or fatigue Heart rhythm changes--fast or irregular heartbeat, dizziness, feeling faint or lightheaded, chest pain, trouble breathing High ammonia level--unusual weakness or fatigue, confusion, loss of appetite, nausea, vomiting, seizures Infection--fever, chills, cough, sore throat, wounds that don't heal, pain or trouble when passing urine, general feeling of discomfort or being unwell Low red blood cell level--unusual weakness or fatigue, dizziness, headache, trouble breathing Pain, tingling, or numbness in the hands or feet, muscle weakness, change in vision, confusion or trouble speaking, loss of balance or coordination, trouble walking, seizures Redness, swelling, and blistering of the skin over hands and feet Severe or prolonged diarrhea Unusual bruising or bleeding Side effects that usually do not require medical attention (report to your care team if they continue or are bothersome): Dry skin Headache Increased tears Nausea Pain, redness, or swelling with sores inside the mouth or throat Sensitivity  to light Vomiting This list may not describe all possible side effects. Call your doctor for medical advice about side effects. You may report side effects to FDA at 1-800-FDA-1088. Where should I keep my medication? This medication is given in a hospital or clinic. It will not be stored at home. NOTE: This sheet is a summary. It may not cover all possible information. If you have questions about this medicine, talk to your doctor, pharmacist, or health care provider.  2024 Elsevier/Gold Standard (2021-08-28 00:00:00)  

## 2022-12-13 ENCOUNTER — Ambulatory Visit: Payer: Medicare Other | Admitting: Oncology

## 2022-12-13 ENCOUNTER — Inpatient Hospital Stay: Payer: Medicare Other

## 2022-12-13 ENCOUNTER — Other Ambulatory Visit: Payer: Medicare Other

## 2022-12-13 ENCOUNTER — Inpatient Hospital Stay: Payer: Medicare Other | Admitting: Oncology

## 2022-12-13 VITALS — BP 124/66 | HR 103 | Temp 98.3°F | Resp 18 | Ht 74.0 in | Wt 164.9 lb

## 2022-12-13 DIAGNOSIS — C20 Malignant neoplasm of rectum: Secondary | ICD-10-CM

## 2022-12-13 DIAGNOSIS — Z5111 Encounter for antineoplastic chemotherapy: Secondary | ICD-10-CM | POA: Diagnosis not present

## 2022-12-13 LAB — CBC AND DIFFERENTIAL
HCT: 38 — AB (ref 41–53)
Hemoglobin: 12.9 — AB (ref 13.5–17.5)
Neutrophils Absolute: 5.25
Platelets: 194 10*3/uL (ref 150–400)
WBC: 7

## 2022-12-13 LAB — CMP (CANCER CENTER ONLY)
ALT: 19 U/L (ref 0–44)
AST: 22 U/L (ref 15–41)
Albumin: 3.8 g/dL (ref 3.5–5.0)
Alkaline Phosphatase: 60 U/L (ref 38–126)
Anion gap: 8 (ref 5–15)
BUN: 18 mg/dL (ref 8–23)
CO2: 24 mmol/L (ref 22–32)
Calcium: 9 mg/dL (ref 8.9–10.3)
Chloride: 100 mmol/L (ref 98–111)
Creatinine: 0.96 mg/dL (ref 0.61–1.24)
GFR, Estimated: >60 mL/min (ref >60)
Glucose, Bld: 97 mg/dL (ref 70–99)
Potassium: 4.2 mmol/L (ref 3.5–5.1)
Sodium: 132 mmol/L — ABNORMAL LOW (ref 135–145)
Total Bilirubin: 0.7 mg/dL (ref 0.3–1.2)
Total Protein: 7.4 g/dL (ref 6.5–8.1)

## 2022-12-13 LAB — CBC: RBC: 4.14 (ref 3.87–5.11)

## 2022-12-13 NOTE — Progress Notes (Signed)
[facility]  [address] [place],  [place]  [address] [phone]  Clinic Day:  12/13/2022  Referring physician: Hadley Pen, MD   HISTORY OF PRESENT ILLNESS:  The patient is an 81 y.o. 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  He is currently receiving neoadjuvant chemoradiation, with the chemotherapy component of his treatment being infusional 5-fluorouracil.  Thus far, the patient has tolerated his chemoradiation very well.  He cannot recall the last time he has noticed any rectal bleeding.  His rectal stools are more formed.  The only issue he is leading to recently is increased urinary incontinence, which is likely due to his radiation irritating his bladder.   PHYSICAL EXAM:  Blood pressure 124/66, pulse (!) 103, temperature 98.3 F (36.8 C), resp. rate 18, height 6\' 2"  (1.88 m), weight 164 lb 14.4 oz (74.8 kg), SpO2 97%. Wt Readings from Last 3 Encounters:  12/13/22 164 lb 14.4 oz (74.8 kg)  12/10/22 162 lb 12 oz (73.8 kg)  12/03/22 164 lb 1.3 oz (74.4 kg)   Body mass index is 21.17 kg/m. Performance status (ECOG): 1 - Symptomatic but completely ambulatory Physical Exam Constitutional:      Appearance: Normal appearance. He is not ill-appearing.  HENT:     Mouth/Throat:     Mouth: Mucous membranes are moist.     Pharynx: Oropharynx is clear. No oropharyngeal exudate or posterior oropharyngeal erythema.  Cardiovascular:     Rate and Rhythm: Normal rate and regular rhythm.     Heart sounds: No murmur heard.    No friction rub. No gallop.  Pulmonary:     Effort: Pulmonary effort is normal. No respiratory distress.     Breath sounds: Normal breath sounds. No wheezing, rhonchi or rales.  Abdominal:     General: Bowel sounds are normal. There is no distension.     Palpations: Abdomen is soft. There is no mass.     Tenderness: There is no abdominal tenderness.  Musculoskeletal:        General: No swelling.     Right lower leg:  No edema.     Left lower leg: No edema.  Lymphadenopathy:     Cervical: No cervical adenopathy.     Upper Body:     Right upper body: No supraclavicular or axillary adenopathy.     Left upper body: No supraclavicular or axillary adenopathy.     Lower Body: No right inguinal adenopathy. No left inguinal adenopathy.  Skin:    General: Skin is warm.     Coloration: Skin is not jaundiced.     Findings: No lesion or rash.  Neurological:     General: No focal deficit present.     Mental Status: He is alert and oriented to person, place, and time. Mental status is at baseline.  Psychiatric:        Mood and Affect: Mood normal.        Behavior: Behavior normal.        Thought Content: Thought content normal.    LABS:      Latest Ref Rng & Units 12/13/2022   12:00 AM 12/06/2022    1:26 PM 11/29/2022   11:11 AM  CBC  WBC  7.0     6.2  8.3   Hemoglobin 13.5 - 17.5 12.9     12.6  12.8   Hematocrit 41 - 53 38     38.4  39.7   Platelets 150 -  400 K/uL 194     200  199      This result is from an external source.      Latest Ref Rng & Units 12/06/2022    1:26 PM 11/29/2022   11:11 AM 11/25/2022   12:00 AM  CMP  Glucose 70 - 99 mg/dL 94  92    BUN 8 - 23 mg/dL 17  21  23       Creatinine 0.61 - 1.24 mg/dL 1.91  4.78  0.8      Sodium 135 - 145 mmol/L 135  135  135      Potassium 3.5 - 5.1 mmol/L 4.4  4.4  4.3      Chloride 98 - 111 mmol/L 104  103  104      CO2 22 - 32 mmol/L 24  25  24       Calcium 8.9 - 10.3 mg/dL 9.1  9.0  9.4      Total Protein 6.5 - 8.1 g/dL 7.0  6.9    Total Bilirubin 0.3 - 1.2 mg/dL 0.8  0.9    Alkaline Phos 38 - 126 U/L 60  59  65      AST 15 - 41 U/L 22  22  32      ALT 0 - 44 U/L 20  19  22          This result is from an external source.    ASSESSMENT & PLAN:  An 82 y.o. 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  Clinically, the patient appears to be doing extremely well.  I see no decline in his  health since his neoadjuvant chemoradiation commenced.  He only has a few more weeks of neoadjuvant chemoradiation left.  I will see him back in 3 weeks for repeat clinical assessment.  By then, he should be near done with his chemoradiation to where the next phase of his neoadjuvant therapy, which will consist of FOLFOX chemotherapy, can commence.  The patient understands all the plans discussed today and is in agreement with them.   Kirby Funk, MD

## 2022-12-16 ENCOUNTER — Other Ambulatory Visit: Payer: Medicare Other

## 2022-12-16 ENCOUNTER — Encounter: Payer: Self-pay | Admitting: Oncology

## 2022-12-17 ENCOUNTER — Inpatient Hospital Stay: Payer: Medicare Other

## 2022-12-17 VITALS — BP 116/85 | HR 96 | Temp 97.8°F | Resp 18 | Ht 74.0 in | Wt 162.0 lb

## 2022-12-17 DIAGNOSIS — Z5111 Encounter for antineoplastic chemotherapy: Secondary | ICD-10-CM | POA: Diagnosis not present

## 2022-12-17 DIAGNOSIS — C20 Malignant neoplasm of rectum: Secondary | ICD-10-CM

## 2022-12-17 MED ORDER — HEPARIN SOD (PORK) LOCK FLUSH 100 UNIT/ML IV SOLN: 500.0000 [IU] | Freq: Once | INTRAVENOUS | Status: DC | PRN

## 2022-12-17 MED ORDER — SODIUM CHLORIDE 0.9 % IV SOLN
225.0000 mg/m2/d | INTRAVENOUS | Status: DC
  Administered 2022-12-17: 3100 mg via INTRAVENOUS
  Filled 2022-12-17: qty 62

## 2022-12-17 MED ORDER — SODIUM CHLORIDE 0.9% FLUSH: 10.0000 mL | INTRAVENOUS | Status: DC | PRN

## 2022-12-17 NOTE — Patient Instructions (Signed)
Fluorouracil Injection What is this medication? FLUOROURACIL (flure oh YOOR a sil) treats some types of cancer. It works by slowing down the growth of cancer cells. This medicine may be used for other purposes; ask your health care provider or pharmacist if you have questions. COMMON BRAND NAME(S): Adrucil What should I tell my care team before I take this medication? They need to know if you have any of these conditions: Blood disorders Dihydropyrimidine dehydrogenase (DPD) deficiency Infection, such as chickenpox, cold sores, herpes Kidney disease Liver disease Poor nutrition Recent or ongoing radiation therapy An unusual or allergic reaction to fluorouracil, other medications, foods, dyes, or preservatives If you or your partner are pregnant or trying to get pregnant Breast-feeding How should I use this medication? This medication is injected into a vein. It is administered by your care team in a hospital or clinic setting. Talk to your care team about the use of this medication in children. Special care may be needed. Overdosage: If you think you have taken too much of this medicine contact a poison control center or emergency room at once. NOTE: This medicine is only for you. Do not share this medicine with others. What if I miss a dose? Keep appointments for follow-up doses. It is important not to miss your dose. Call your care team if you are unable to keep an appointment. What may interact with this medication? Do not take this medication with any of the following: Live virus vaccines This medication may also interact with the following: Medications that treat or prevent blood clots, such as warfarin, enoxaparin, dalteparin This list may not describe all possible interactions. Give your health care provider a list of all the medicines, herbs, non-prescription drugs, or dietary supplements you use. Also tell them if you smoke, drink alcohol, or use illegal drugs. Some items may  interact with your medicine. What should I watch for while using this medication? Your condition will be monitored carefully while you are receiving this medication. This medication may make you feel generally unwell. This is not uncommon as chemotherapy can affect healthy cells as well as cancer cells. Report any side effects. Continue your course of treatment even though you feel ill unless your care team tells you to stop. In some cases, you may be given additional medications to help with side effects. Follow all directions for their use. This medication may increase your risk of getting an infection. Call your care team for advice if you get a fever, chills, sore throat, or other symptoms of a cold or flu. Do not treat yourself. Try to avoid being around people who are sick. This medication may increase your risk to bruise or bleed. Call your care team if you notice any unusual bleeding. Be careful brushing or flossing your teeth or using a toothpick because you may get an infection or bleed more easily. If you have any dental work done, tell your dentist you are receiving this medication. Avoid taking medications that contain aspirin, acetaminophen, ibuprofen, naproxen, or ketoprofen unless instructed by your care team. These medications may hide a fever. Do not treat diarrhea with over the counter products. Contact your care team if you have diarrhea that lasts more than 2 days or if it is severe and watery. This medication can make you more sensitive to the sun. Keep out of the sun. If you cannot avoid being in the sun, wear protective clothing and sunscreen. Do not use sun lamps, tanning beds, or tanning booths. Talk to   your care team if you or your partner wish to become pregnant or think you might be pregnant. This medication can cause serious birth defects if taken during pregnancy and for 3 months after the last dose. A reliable form of contraception is recommended while taking this  medication and for 3 months after the last dose. Talk to your care team about effective forms of contraception. Do not father a child while taking this medication and for 3 months after the last dose. Use a condom while having sex during this time period. Do not breastfeed while taking this medication. This medication may cause infertility. Talk to your care team if you are concerned about your fertility. What side effects may I notice from receiving this medication? Side effects that you should report to your care team as soon as possible: Allergic reactions--skin rash, itching, hives, swelling of the face, lips, tongue, or throat Heart attack--pain or tightness in the chest, shoulders, arms, or jaw, nausea, shortness of breath, cold or clammy skin, feeling faint or lightheaded Heart failure--shortness of breath, swelling of the ankles, feet, or hands, sudden weight gain, unusual weakness or fatigue Heart rhythm changes--fast or irregular heartbeat, dizziness, feeling faint or lightheaded, chest pain, trouble breathing High ammonia level--unusual weakness or fatigue, confusion, loss of appetite, nausea, vomiting, seizures Infection--fever, chills, cough, sore throat, wounds that don't heal, pain or trouble when passing urine, general feeling of discomfort or being unwell Low red blood cell level--unusual weakness or fatigue, dizziness, headache, trouble breathing Pain, tingling, or numbness in the hands or feet, muscle weakness, change in vision, confusion or trouble speaking, loss of balance or coordination, trouble walking, seizures Redness, swelling, and blistering of the skin over hands and feet Severe or prolonged diarrhea Unusual bruising or bleeding Side effects that usually do not require medical attention (report to your care team if they continue or are bothersome): Dry skin Headache Increased tears Nausea Pain, redness, or swelling with sores inside the mouth or throat Sensitivity  to light Vomiting This list may not describe all possible side effects. Call your doctor for medical advice about side effects. You may report side effects to FDA at 1-800-FDA-1088. Where should I keep my medication? This medication is given in a hospital or clinic. It will not be stored at home. NOTE: This sheet is a summary. It may not cover all possible information. If you have questions about this medicine, talk to your doctor, pharmacist, or health care provider.  2024 Elsevier/Gold Standard (2021-08-28 00:00:00)  

## 2022-12-19 ENCOUNTER — Ambulatory Visit: Payer: Medicare Other | Admitting: Oncology

## 2022-12-19 ENCOUNTER — Other Ambulatory Visit: Payer: Medicare Other

## 2022-12-20 ENCOUNTER — Inpatient Hospital Stay: Payer: Medicare Other

## 2022-12-20 DIAGNOSIS — Z5111 Encounter for antineoplastic chemotherapy: Secondary | ICD-10-CM | POA: Diagnosis not present

## 2022-12-20 DIAGNOSIS — C20 Malignant neoplasm of rectum: Secondary | ICD-10-CM

## 2022-12-20 LAB — CMP (CANCER CENTER ONLY)
ALT: 19 U/L (ref 0–44)
AST: 22 U/L (ref 15–41)
Albumin: 3.5 g/dL (ref 3.5–5.0)
Alkaline Phosphatase: 61 U/L (ref 38–126)
Anion gap: 9 (ref 5–15)
BUN: 22 mg/dL (ref 8–23)
CO2: 24 mmol/L (ref 22–32)
Calcium: 8.7 mg/dL — ABNORMAL LOW (ref 8.9–10.3)
Chloride: 100 mmol/L (ref 98–111)
Creatinine: 0.98 mg/dL (ref 0.61–1.24)
GFR, Estimated: >60 mL/min (ref >60)
Glucose, Bld: 103 mg/dL — ABNORMAL HIGH (ref 70–99)
Potassium: 4.2 mmol/L (ref 3.5–5.1)
Sodium: 133 mmol/L — ABNORMAL LOW (ref 135–145)
Total Bilirubin: 0.8 mg/dL (ref 0.3–1.2)
Total Protein: 6.7 g/dL (ref 6.5–8.1)

## 2022-12-20 LAB — CBC WITH DIFFERENTIAL (CANCER CENTER ONLY)
Abs Immature Granulocytes: 0.02 10*3/uL (ref 0.00–0.07)
Basophils Absolute: 0 10*3/uL (ref 0.0–0.1)
Basophils Relative: 0 %
Eosinophils Absolute: 0.3 10*3/uL (ref 0.0–0.5)
Eosinophils Relative: 4 %
HCT: 38.7 % — ABNORMAL LOW (ref 39.0–52.0)
Hemoglobin: 12.7 g/dL — ABNORMAL LOW (ref 13.0–17.0)
Immature Granulocytes: 0 %
Lymphocytes Relative: 6 %
Lymphs Abs: 0.4 10*3/uL — ABNORMAL LOW (ref 0.7–4.0)
MCH: 31 pg (ref 26.0–34.0)
MCHC: 32.8 g/dL (ref 30.0–36.0)
MCV: 94.4 fL (ref 80.0–100.0)
Monocytes Absolute: 1 10*3/uL (ref 0.1–1.0)
Monocytes Relative: 15 %
Neutro Abs: 5.3 10*3/uL (ref 1.7–7.7)
Neutrophils Relative %: 75 %
Platelet Count: 182 10*3/uL (ref 150–400)
RBC: 4.1 MIL/uL — ABNORMAL LOW (ref 4.22–5.81)
RDW: 16.7 % — ABNORMAL HIGH (ref 11.5–15.5)
WBC Count: 7 10*3/uL (ref 4.0–10.5)
nRBC: 0 % (ref 0.0–0.2)

## 2022-12-24 ENCOUNTER — Ambulatory Visit: Payer: Medicare Other

## 2022-12-24 ENCOUNTER — Inpatient Hospital Stay: Payer: Medicare Other

## 2022-12-24 VITALS — BP 120/74 | HR 95 | Temp 98.0°F | Resp 18 | Ht 74.0 in | Wt 161.0 lb

## 2022-12-24 DIAGNOSIS — C20 Malignant neoplasm of rectum: Secondary | ICD-10-CM

## 2022-12-24 DIAGNOSIS — Z5111 Encounter for antineoplastic chemotherapy: Secondary | ICD-10-CM | POA: Diagnosis not present

## 2022-12-24 MED ORDER — SODIUM CHLORIDE 0.9 % IV SOLN
225.0000 mg/m2/d | INTRAVENOUS | Status: DC
  Administered 2022-12-24: 3100 mg via INTRAVENOUS
  Filled 2022-12-24: qty 62

## 2022-12-24 NOTE — Patient Instructions (Signed)
Fluorouracil Injection What is this medication? FLUOROURACIL (flure oh YOOR a sil) treats some types of cancer. It works by slowing down the growth of cancer cells. This medicine may be used for other purposes; ask your health care provider or pharmacist if you have questions. COMMON BRAND NAME(S): Adrucil What should I tell my care team before I take this medication? They need to know if you have any of these conditions: Blood disorders Dihydropyrimidine dehydrogenase (DPD) deficiency Infection, such as chickenpox, cold sores, herpes Kidney disease Liver disease Poor nutrition Recent or ongoing radiation therapy An unusual or allergic reaction to fluorouracil, other medications, foods, dyes, or preservatives If you or your partner are pregnant or trying to get pregnant Breast-feeding How should I use this medication? This medication is injected into a vein. It is administered by your care team in a hospital or clinic setting. Talk to your care team about the use of this medication in children. Special care may be needed. Overdosage: If you think you have taken too much of this medicine contact a poison control center or emergency room at once. NOTE: This medicine is only for you. Do not share this medicine with others. What if I miss a dose? Keep appointments for follow-up doses. It is important not to miss your dose. Call your care team if you are unable to keep an appointment. What may interact with this medication? Do not take this medication with any of the following: Live virus vaccines This medication may also interact with the following: Medications that treat or prevent blood clots, such as warfarin, enoxaparin, dalteparin This list may not describe all possible interactions. Give your health care provider a list of all the medicines, herbs, non-prescription drugs, or dietary supplements you use. Also tell them if you smoke, drink alcohol, or use illegal drugs. Some items may  interact with your medicine. What should I watch for while using this medication? Your condition will be monitored carefully while you are receiving this medication. This medication may make you feel generally unwell. This is not uncommon as chemotherapy can affect healthy cells as well as cancer cells. Report any side effects. Continue your course of treatment even though you feel ill unless your care team tells you to stop. In some cases, you may be given additional medications to help with side effects. Follow all directions for their use. This medication may increase your risk of getting an infection. Call your care team for advice if you get a fever, chills, sore throat, or other symptoms of a cold or flu. Do not treat yourself. Try to avoid being around people who are sick. This medication may increase your risk to bruise or bleed. Call your care team if you notice any unusual bleeding. Be careful brushing or flossing your teeth or using a toothpick because you may get an infection or bleed more easily. If you have any dental work done, tell your dentist you are receiving this medication. Avoid taking medications that contain aspirin, acetaminophen, ibuprofen, naproxen, or ketoprofen unless instructed by your care team. These medications may hide a fever. Do not treat diarrhea with over the counter products. Contact your care team if you have diarrhea that lasts more than 2 days or if it is severe and watery. This medication can make you more sensitive to the sun. Keep out of the sun. If you cannot avoid being in the sun, wear protective clothing and sunscreen. Do not use sun lamps, tanning beds, or tanning booths. Talk to   your care team if you or your partner wish to become pregnant or think you might be pregnant. This medication can cause serious birth defects if taken during pregnancy and for 3 months after the last dose. A reliable form of contraception is recommended while taking this  medication and for 3 months after the last dose. Talk to your care team about effective forms of contraception. Do not father a child while taking this medication and for 3 months after the last dose. Use a condom while having sex during this time period. Do not breastfeed while taking this medication. This medication may cause infertility. Talk to your care team if you are concerned about your fertility. What side effects may I notice from receiving this medication? Side effects that you should report to your care team as soon as possible: Allergic reactions--skin rash, itching, hives, swelling of the face, lips, tongue, or throat Heart attack--pain or tightness in the chest, shoulders, arms, or jaw, nausea, shortness of breath, cold or clammy skin, feeling faint or lightheaded Heart failure--shortness of breath, swelling of the ankles, feet, or hands, sudden weight gain, unusual weakness or fatigue Heart rhythm changes--fast or irregular heartbeat, dizziness, feeling faint or lightheaded, chest pain, trouble breathing High ammonia level--unusual weakness or fatigue, confusion, loss of appetite, nausea, vomiting, seizures Infection--fever, chills, cough, sore throat, wounds that don't heal, pain or trouble when passing urine, general feeling of discomfort or being unwell Low red blood cell level--unusual weakness or fatigue, dizziness, headache, trouble breathing Pain, tingling, or numbness in the hands or feet, muscle weakness, change in vision, confusion or trouble speaking, loss of balance or coordination, trouble walking, seizures Redness, swelling, and blistering of the skin over hands and feet Severe or prolonged diarrhea Unusual bruising or bleeding Side effects that usually do not require medical attention (report to your care team if they continue or are bothersome): Dry skin Headache Increased tears Nausea Pain, redness, or swelling with sores inside the mouth or throat Sensitivity  to light Vomiting This list may not describe all possible side effects. Call your doctor for medical advice about side effects. You may report side effects to FDA at 1-800-FDA-1088. Where should I keep my medication? This medication is given in a hospital or clinic. It will not be stored at home. NOTE: This sheet is a summary. It may not cover all possible information. If you have questions about this medicine, talk to your doctor, pharmacist, or health care provider.  2024 Elsevier/Gold Standard (2021-08-28 00:00:00)  

## 2022-12-25 ENCOUNTER — Other Ambulatory Visit: Payer: Self-pay

## 2022-12-25 DIAGNOSIS — K521 Toxic gastroenteritis and colitis: Secondary | ICD-10-CM

## 2022-12-25 MED ORDER — DIPHENOXYLATE-ATROPINE 2.5-0.025 MG PO TABS
1.0000 | ORAL_TABLET | Freq: Four times a day (QID) | ORAL | 0 refills | Status: DC | PRN
Start: 2022-12-25 — End: 2023-01-03

## 2022-12-26 ENCOUNTER — Encounter: Payer: Self-pay | Admitting: Oncology

## 2022-12-30 ENCOUNTER — Encounter: Payer: Self-pay | Admitting: Oncology

## 2022-12-30 ENCOUNTER — Other Ambulatory Visit: Payer: Self-pay

## 2022-12-30 DIAGNOSIS — C20 Malignant neoplasm of rectum: Secondary | ICD-10-CM

## 2022-12-30 DIAGNOSIS — Z5111 Encounter for antineoplastic chemotherapy: Secondary | ICD-10-CM | POA: Diagnosis not present

## 2022-12-30 MED ORDER — SODIUM CHLORIDE 0.9 % IV SOLN: Freq: Once | INTRAVENOUS | Status: AC

## 2022-12-30 NOTE — Progress Notes (Signed)
Patient received 1 liter NS over 2 hour period d/t low blood pressure and tachycardia.  Dr. Sherral Hammers called to about metoprolol and spirolactone.  Patient was told to take an additional dose of metoprolol and to hold spirolactone tonight.  He was instructed to take 2 lomotil every 2 hours if diarrhea is not resolved, can take up to 8 tablets in a 24 hour period.  PT/INR was 9.2 on 12/27/2022. IV Vitamin K was administered while admitted to PCU.  On 12/28/22 INR 1.8.   Per Dr. Melvyn Neth resume coumadin tonight, recheck his INR tomorrow 01/01/2023.  Patient and daughter understood all instructions and recommendation given.

## 2022-12-31 ENCOUNTER — Inpatient Hospital Stay: Payer: Medicare Other

## 2022-12-31 ENCOUNTER — Other Ambulatory Visit: Payer: Self-pay

## 2022-12-31 VITALS — BP 104/68 | HR 69 | Temp 97.8°F | Resp 18 | Ht 74.0 in | Wt 167.1 lb

## 2022-12-31 DIAGNOSIS — Z5111 Encounter for antineoplastic chemotherapy: Secondary | ICD-10-CM | POA: Diagnosis not present

## 2022-12-31 DIAGNOSIS — C20 Malignant neoplasm of rectum: Secondary | ICD-10-CM

## 2022-12-31 DIAGNOSIS — Z7901 Long term (current) use of anticoagulants: Secondary | ICD-10-CM

## 2022-12-31 LAB — CBC AND DIFFERENTIAL
HCT: 34 — AB (ref 41–53)
Hemoglobin: 11.8 — AB (ref 13.5–17.5)
Neutrophils Absolute: 5.35
Platelets: 235 10*3/uL (ref 150–400)
WBC: 6.6

## 2022-12-31 LAB — COMPREHENSIVE METABOLIC PANEL
Albumin: 3.1 — AB (ref 3.5–5.0)
Calcium: 8.6 — AB (ref 8.7–10.7)

## 2022-12-31 LAB — BASIC METABOLIC PANEL
BUN: 20 (ref 4–21)
CO2: 21 (ref 13–22)
Chloride: 102 (ref 99–108)
Creatinine: 0.9 (ref 0.6–1.3)
Glucose: 102
Potassium: 4.7 mEq/L (ref 3.5–5.1)
Sodium: 130 — AB (ref 137–147)

## 2022-12-31 LAB — POCT INR: INR: 1.8 — AB (ref 0.80–1.20)

## 2022-12-31 LAB — CBC: RBC: 3.74 — AB (ref 3.87–5.11)

## 2022-12-31 LAB — HEPATIC FUNCTION PANEL
ALT: 25 U/L (ref 10–40)
AST: 37 (ref 14–40)
Alkaline Phosphatase: 80 (ref 25–125)
Bilirubin, Total: 1.5

## 2022-12-31 LAB — PROTIME-INR: Protime: 18.5 — AB (ref 10.0–13.8)

## 2022-12-31 MED ORDER — SODIUM CHLORIDE 0.9 % IV SOLN: Freq: Once | INTRAVENOUS | Status: AC

## 2022-12-31 MED ORDER — HEPARIN SOD (PORK) LOCK FLUSH 100 UNIT/ML IV SOLN
500.0000 [IU] | Freq: Once | INTRAVENOUS | Status: AC | PRN
  Administered 2022-12-31: 500 [IU]

## 2022-12-31 MED ORDER — SODIUM CHLORIDE 0.9% FLUSH
10.0000 mL | INTRAVENOUS | Status: DC | PRN
  Administered 2022-12-31: 10 mL

## 2022-12-31 NOTE — Patient Instructions (Signed)
Dehydration, Adult Dehydration is a condition in which there is not enough water or other fluids in the body. This happens when a person loses more fluids than they take in. Important organs cannot work right without the right amount of fluids. Any loss of fluids from the body can cause dehydration. Dehydration can be mild, worse, or very bad. It should be treated right away to keep it from getting very bad. What are the causes? Conditions that cause loss of water in the body. They include: Watery poop (diarrhea). Vomiting. Sweating a lot. Fever. Infection. Peeing (urinating) a lot. Not drinking enough fluids. Certain medicines, such as medicines that take extra fluid out of the body (diuretics). Lack of safe drinking water. Not being able to get enough water and food. What increases the risk? Having a long-term (chronic) illness that has not been treated the right way, such as: Diabetes. Heart disease. Kidney disease. Being 65 years of age or older. Having a disability. Living in a place that is high above the ground or sea (high in altitude). The thinner, drier air causes more fluid loss. Doing exercises that put stress on your body for a long time. Being active when in hot places. What are the signs or symptoms? Symptoms of dehydration depend on how bad it is. Mild or worse dehydration Thirst. Dry lips or dry mouth. Feeling dizzy or light-headed. Muscle cramps. Passing little pee or dark pee. Pee may be the color of tea. Headache. Very bad dehydration Changes in skin. Skin may: Be cold to the touch (clammy). Be blotchy or pale. Not go back to normal right after you pinch it and let it go. Little or no tears, pee, or sweat. Fast breathing. Low blood pressure. Weak pulse. Pulse that is more than 100 beats a minute when you are sitting still. Other changes, such as: Feeling very thirsty. Eyes that look hollow (sunken). Cold hands and feet. Being confused. Being very  tired (lethargic) or having trouble waking from sleep. Losing weight. Loss of consciousness. How is this treated? Treatment for this condition depends on how bad your dehydration is. Treatment should start right away. Do not wait until your condition gets very bad. Very bad dehydration is an emergency. You will need to go to a hospital. Mild or worse dehydration can be treated at home. You may be asked to: Drink more fluids. Drink an oral rehydration solution (ORS). This drink gives you the right amount of fluids, salts, and minerals (electrolytes). Very bad dehydration can be treated: With fluids through an IV tube. By correcting low levels of electrolytes in the body. By treating the problem that caused your dehydration. Follow these instructions at home: Oral rehydration solution If told by your doctor, drink an ORS: Make an ORS. Use instructions on the package. Start by drinking small amounts, about  cup (120 mL) every 5-10 minutes. Slowly drink more until you have had the amount that your doctor said to have.  Eating and drinking  Drink enough clear fluid to keep your pee pale yellow. If you were told to drink an ORS, finish the ORS first. Then, start slowly drinking other clear fluids. Drink fluids such as: Water. Do not drink only water. Doing that can make the salt (sodium) level in your body get too low. Water from ice chips you suck on. Fruit juice that you have added water to (diluted). Low-calorie sports drinks. Eat foods that have the right amounts of salts and minerals, such as bananas, oranges, potatoes,   tomatoes, or spinach. Do not drink alcohol. Avoid drinks that have caffeine or sugar. These include:: High-calorie sports drinks. Fruit juice that you did not add water to. Soda. Coffee or energy drinks. Avoid foods that are greasy or have a lot of fat or sugar. General instructions Take over-the-counter and prescription medicines only as told by your doctor. Do  not take sodium tablets. Doing that can make the salt level in your body get too high. Return to your normal activities as told by your doctor. Ask your doctor what activities are safe for you. Keep all follow-up visits. Your doctor may check and change your treatment. Contact a doctor if: You have pain in your belly (abdomen) and the pain: Gets worse. Stays in one place. You have a rash. You have a stiff neck. You get angry or annoyed more easily than normal. You are more tired or have a harder time waking than normal. You feel weak or dizzy. You feel very thirsty. Get help right away if: You have any symptoms of very bad dehydration. You vomit every time you eat or drink. Your vomiting gets worse, does not go away, or you vomit blood or green stuff. You are getting treatment, but symptoms are getting worse. You have a fever. You have a very bad headache. You have: Diarrhea that gets worse or does not go away. Blood in your poop (stool). This may cause poop to look black and tarry. No pee in 6-8 hours. Only a small amount of pee in 6-8 hours, and the pee is very dark. You have trouble breathing. These symptoms may be an emergency. Get help right away. Call 911. Do not wait to see if the symptoms will go away. Do not drive yourself to the hospital. This information is not intended to replace advice given to you by your health care provider. Make sure you discuss any questions you have with your health care provider. Document Revised: 11/19/2021 Document Reviewed: 11/19/2021 Elsevier Patient Education  2024 [employer] Inc.  

## 2022-12-31 NOTE — Progress Notes (Signed)
Patient sent to infusion center for pump dc and one liter of normal saline.  Labs collected. VS both sitting and standing sent by fax with labs to Dr. Sherral Hammers.

## 2023-01-01 ENCOUNTER — Encounter: Payer: Self-pay | Admitting: Vascular Surgery

## 2023-01-03 ENCOUNTER — Inpatient Hospital Stay: Payer: Medicare Other

## 2023-01-03 ENCOUNTER — Other Ambulatory Visit: Payer: Self-pay | Admitting: Oncology

## 2023-01-03 ENCOUNTER — Inpatient Hospital Stay (HOSPITAL_BASED_OUTPATIENT_CLINIC_OR_DEPARTMENT_OTHER): Payer: Medicare Other | Admitting: Oncology

## 2023-01-03 ENCOUNTER — Encounter: Payer: Self-pay | Admitting: Oncology

## 2023-01-03 VITALS — BP 102/70 | HR 85 | Temp 98.0°F | Resp 16 | Ht 74.0 in | Wt 167.1 lb

## 2023-01-03 DIAGNOSIS — C20 Malignant neoplasm of rectum: Secondary | ICD-10-CM | POA: Diagnosis not present

## 2023-01-03 DIAGNOSIS — K521 Toxic gastroenteritis and colitis: Secondary | ICD-10-CM

## 2023-01-03 MED ORDER — DIPHENOXYLATE-ATROPINE 2.5-0.025 MG PO TABS: 1.0000 | ORAL_TABLET | Freq: Four times a day (QID) | ORAL | 0 refills | Status: DC | PRN

## 2023-01-03 NOTE — Progress Notes (Unsigned)
[facility]  [address] [place],  [place]  [address] [phone]  Clinic Day:  12/13/2022  Referring physician: Hadley Pen, MD   HISTORY OF PRESENT ILLNESS:  The patient is an 82 y.o. 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  He is currently receiving neoadjuvant chemoradiation, with the chemotherapy component of his treatment being infusional 5-fluorouracil.  Thus far, the patient has tolerated his chemoradiation very well.  He cannot recall the last time he has noticed any rectal bleeding.  His rectal stools are more formed.  The only issue he is leading to recently is increased urinary incontinence, which is likely due to his radiation irritating his bladder.   PHYSICAL EXAM:  There were no vitals taken for this visit. Wt Readings from Last 3 Encounters:  12/31/22 167 lb 4.8 oz (75.9 kg)  12/31/22 167 lb 1.9 oz (75.8 kg)  12/30/22 165 lb 14.4 oz (75.3 kg)   There is no height or weight on file to calculate BMI. Performance status (ECOG): 1 - Symptomatic but completely ambulatory Physical Exam Constitutional:      Appearance: Normal appearance. He is not ill-appearing.  HENT:     Mouth/Throat:     Mouth: Mucous membranes are moist.     Pharynx: Oropharynx is clear. No oropharyngeal exudate or posterior oropharyngeal erythema.  Cardiovascular:     Rate and Rhythm: Normal rate and regular rhythm.     Heart sounds: No murmur heard.    No friction rub. No gallop.  Pulmonary:     Effort: Pulmonary effort is normal. No respiratory distress.     Breath sounds: Normal breath sounds. No wheezing, rhonchi or rales.  Abdominal:     General: Bowel sounds are normal. There is no distension.     Palpations: Abdomen is soft. There is no mass.     Tenderness: There is no abdominal tenderness.  Musculoskeletal:        General: No swelling.     Right lower leg: No edema.     Left lower leg: No edema.  Lymphadenopathy:     Cervical: No  cervical adenopathy.     Upper Body:     Right upper body: No supraclavicular or axillary adenopathy.     Left upper body: No supraclavicular or axillary adenopathy.     Lower Body: No right inguinal adenopathy. No left inguinal adenopathy.  Skin:    General: Skin is warm.     Coloration: Skin is not jaundiced.     Findings: No lesion or rash.  Neurological:     General: No focal deficit present.     Mental Status: He is alert and oriented to person, place, and time. Mental status is at baseline.  Psychiatric:        Mood and Affect: Mood normal.        Behavior: Behavior normal.        Thought Content: Thought content normal.   LABS:      Latest Ref Rng & Units 12/31/2022   12:00 AM 12/20/2022    2:14 PM 12/13/2022   12:00 AM  CBC  WBC  6.6     7.0  7.0      Hemoglobin 13.5 - 17.5 11.8     12.7  12.9      Hematocrit 41 - 53 34     38.7  38      Platelets 150 - 400 K/uL 235  182  194         This result is from an external source.      Latest Ref Rng & Units 12/31/2022   12:00 AM 12/20/2022    2:14 PM 12/13/2022    1:41 PM  CMP  Glucose 70 - 99 mg/dL  161  97   BUN 4 - 21 20     22  18    Creatinine 0.6 - 1.3 0.9     0.98  0.96   Sodium 137 - 147 130     133  132   Potassium 3.5 - 5.1 mEq/L 4.7     4.2  4.2   Chloride 99 - 108 102     100  100   CO2 13 - 22 21     24  24    Calcium 8.7 - 10.7 8.6     8.7  9.0   Total Protein 6.5 - 8.1 g/dL  6.7  7.4   Total Bilirubin 0.3 - 1.2 mg/dL  0.8  0.7   Alkaline Phos 25 - 125 80     61  60   AST 14 - 40 37     22  22   ALT 10 - 40 U/L 25     19  19       This result is from an external source.    ASSESSMENT & PLAN:  An 82 y.o. 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  Clinically, the patient appears to be doing extremely well.  I see no decline in his health since his neoadjuvant chemoradiation commenced.  He only has a few more weeks of neoadjuvant chemoradiation  left.  I will see him back in 3 weeks for repeat clinical assessment.  By then, he should be near done with his chemoradiation to where the next phase of his neoadjuvant therapy, which will consist of FOLFOX chemotherapy, can commence.  The patient understands all the plans discussed today and is in agreement with them.  Sharrell Krawiec Kirby Funk, MD

## 2023-01-03 NOTE — Progress Notes (Signed)
START ON PATHWAY REGIMEN - Colorectal     A cycle is every 14 days:     Oxaliplatin      Leucovorin      Fluorouracil      Fluorouracil   **Always confirm dose/schedule in your pharmacy ordering system**  Patient Characteristics: Preoperative or Nonsurgical Candidate, M0 (Clinical Staging), Rectal, cT2, cN1 or cT3, cN0-1, and Candidate for Sphincter-sparing Surgery Tumor Location: Rectal Therapeutic Status: Preoperative or Nonsurgical Candidate, M0 (Clinical Staging) AJCC T Category: cT3 AJCC N Category: cN1 AJCC M Category: cM0 AJCC 8 Stage Grouping: IIIB Intent of Therapy: Curative Intent, Discussed with Patient

## 2023-01-07 ENCOUNTER — Encounter: Payer: Self-pay | Admitting: Oncology

## 2023-01-07 ENCOUNTER — Telehealth: Payer: Self-pay | Admitting: Oncology

## 2023-01-07 NOTE — Telephone Encounter (Signed)
01/07/23 Spoke with patient and confirmed next appt.

## 2023-01-08 ENCOUNTER — Encounter: Payer: Self-pay | Admitting: Oncology

## 2023-01-15 ENCOUNTER — Encounter: Payer: Self-pay | Admitting: Oncology

## 2023-01-17 ENCOUNTER — Inpatient Hospital Stay: Payer: Medicare Other

## 2023-01-17 ENCOUNTER — Inpatient Hospital Stay: Payer: Medicare Other | Admitting: Oncology

## 2023-01-20 DIAGNOSIS — I959 Hypotension, unspecified: Secondary | ICD-10-CM | POA: Diagnosis not present

## 2023-01-20 DIAGNOSIS — I4891 Unspecified atrial fibrillation: Secondary | ICD-10-CM | POA: Diagnosis not present

## 2023-01-20 DIAGNOSIS — I509 Heart failure, unspecified: Secondary | ICD-10-CM | POA: Diagnosis not present

## 2023-01-21 ENCOUNTER — Encounter: Payer: Self-pay | Admitting: Oncology

## 2023-01-21 ENCOUNTER — Ambulatory Visit: Payer: Medicare Other

## 2023-01-21 DIAGNOSIS — I509 Heart failure, unspecified: Secondary | ICD-10-CM | POA: Diagnosis not present

## 2023-01-21 DIAGNOSIS — I4891 Unspecified atrial fibrillation: Secondary | ICD-10-CM | POA: Diagnosis not present

## 2023-01-21 DIAGNOSIS — I959 Hypotension, unspecified: Secondary | ICD-10-CM | POA: Diagnosis not present

## 2023-01-21 DIAGNOSIS — I361 Nonrheumatic tricuspid (valve) insufficiency: Secondary | ICD-10-CM | POA: Diagnosis not present

## 2023-01-22 DIAGNOSIS — I509 Heart failure, unspecified: Secondary | ICD-10-CM | POA: Diagnosis not present

## 2023-01-22 DIAGNOSIS — I4891 Unspecified atrial fibrillation: Secondary | ICD-10-CM | POA: Diagnosis not present

## 2023-01-22 DIAGNOSIS — I959 Hypotension, unspecified: Secondary | ICD-10-CM | POA: Diagnosis not present

## 2023-01-23 DIAGNOSIS — I959 Hypotension, unspecified: Secondary | ICD-10-CM | POA: Diagnosis not present

## 2023-01-23 DIAGNOSIS — I509 Heart failure, unspecified: Secondary | ICD-10-CM | POA: Diagnosis not present

## 2023-01-23 DIAGNOSIS — I4891 Unspecified atrial fibrillation: Secondary | ICD-10-CM | POA: Diagnosis not present

## 2023-01-24 DIAGNOSIS — I959 Hypotension, unspecified: Secondary | ICD-10-CM | POA: Diagnosis not present

## 2023-01-24 DIAGNOSIS — I509 Heart failure, unspecified: Secondary | ICD-10-CM | POA: Diagnosis not present

## 2023-01-24 DIAGNOSIS — I4891 Unspecified atrial fibrillation: Secondary | ICD-10-CM | POA: Diagnosis not present

## 2023-01-30 NOTE — Progress Notes (Deleted)
Cardiology Office Note:    Date:  01/30/2023   ID:  Lee Strong, Lee Strong Aug 13, 1940, MRN [id number]  PCP:  Lee Pen, MD  Cardiologist:  Lee Herrlich, MD    Referring MD: Lee Pen, MD    ASSESSMENT:    1. Chronic atrial fibrillation (HCC)   2. Chronic anticoagulation   3. Hypertensive heart disease with heart failure (HCC)   4. RBBB    PLAN:    In order of problems listed above:  ***   Next appointment: ***   Medication Adjustments/Labs and Tests Ordered: Current medicines are reviewed at length with the patient today.  Concerns regarding medicines are outlined above.  No orders of the defined types were placed in this encounter.  No orders of the defined types were placed in this encounter.    History of Present Illness:    Lee Strong is a 82 y.o. male with a hx of longstanding chronic atrial fibrillation with anticoagulation hypertensive heart disease with heart failure mildly reduced ejection fraction 61% by gated wall 45 to 50% by echo last seen 02/13/2022.  Compliance with diet, lifestyle and medications: ***  He was at [facility] recently unfortunately symptomatic with hypotension related to chemotherapy for his rectal cancer and echocardiogram during that admission showing normal ejection fraction he had rapid atrial fibrillation associated with hypotension and was given a single dose of digoxin initially received amiodarone stopped.  Does not appear he is seen by cardiology during this admission. Past Medical History:  Diagnosis Date   Atrial fibrillation (HCC) 06/17/2017   Chronic atrial fibrillation (HCC) 12/20/2014   Overview:  .CHADS2 vasc score=2   Complication of anesthesia    Essential hypertension 12/20/2014   Hyperlipidemia 06/17/2017   Hypertension 06/17/2017   Long term (current) use of anticoagulants 12/20/2014   PONV (postoperative nausea and vomiting)    Prostate cancer (HCC)    Short of breath on exertion  06/17/2017   Weakness 06/17/2017    Current Medications: No outpatient medications have been marked as taking for the 01/31/23 encounter (Appointment) with Lee Daub, MD.      EKGs/Labs/Other Studies Reviewed:    The following studies were reviewed today:  Cardiac Studies & Procedures     STRESS TESTS  MYOCARDIAL PERFUSION IMAGING 07/22/2017  Narrative  Nuclear stress EF: 61%. The left ventricular ejection fraction is normal (55-65%).  Defect 1: There is a small defect of mild severity present in the apex location. This is most consistent with apical thinning  The study is normal. no ischemia. no infarction  This is a low risk study.                  Recent Labs: 12/31/2022: ALT 25; BUN 20; Creatinine 0.9; Hemoglobin 11.8; Platelets 235; Potassium 4.7; Sodium 130  Recent Lipid Panel No results found for: "CHOL", "TRIG", "HDL", "CHOLHDL", "VLDL", "LDLCALC", "LDLDIRECT"  Physical Exam:    VS:  There were no vitals taken for this visit.    Wt Readings from Last 3 Encounters:  01/03/23 167 lb 1.6 oz (75.8 kg)  12/31/22 167 lb 4.8 oz (75.9 kg)  12/31/22 167 lb 1.9 oz (75.8 kg)     GEN: *** Well nourished, well developed in no acute distress HEENT: Normal NECK: No JVD; No carotid bruits LYMPHATICS: No lymphadenopathy CARDIAC: ***RRR, no murmurs, rubs, gallops RESPIRATORY:  Clear to auscultation without rales, wheezing or rhonchi  ABDOMEN: Soft, non-tender, non-distended MUSCULOSKELETAL:  No edema; No deformity  SKIN: Warm and dry NEUROLOGIC:  Alert and oriented x 3 PSYCHIATRIC:  Normal affect    Signed, Lee Herrlich, MD  01/30/2023 8:02 PM    [facility]

## 2023-01-31 ENCOUNTER — Ambulatory Visit: Payer: Medicare Other | Attending: Cardiology | Admitting: Cardiology

## 2023-01-31 DIAGNOSIS — I482 Chronic atrial fibrillation, unspecified: Secondary | ICD-10-CM

## 2023-01-31 DIAGNOSIS — Z7901 Long term (current) use of anticoagulants: Secondary | ICD-10-CM

## 2023-01-31 DIAGNOSIS — I4510 Unspecified right bundle-branch block: Secondary | ICD-10-CM

## 2023-01-31 DIAGNOSIS — I110 Hypertensive heart disease with heart failure: Secondary | ICD-10-CM

## 2023-02-03 ENCOUNTER — Telehealth: Payer: Self-pay | Admitting: Oncology

## 2023-02-03 NOTE — Telephone Encounter (Signed)
Contacted pt to schedule an appt. Unable to reach via phone, voicemail was left.

## 2023-02-06 NOTE — Telephone Encounter (Signed)
Contacted pt to schedule an appt for MedOnc and RadOnc consultation. Unable to reach via phone, voicemail was left.

## 2023-02-12 ENCOUNTER — Other Ambulatory Visit: Payer: Self-pay | Admitting: Cardiology

## 2023-02-20 NOTE — Telephone Encounter (Signed)
Contacted pt to schedule an appt. Unable to reach via phone, so I contacted daughter but unable to reach her so a  voicemail was left.    follow up appt Received: Lee Drone, RN  P [facility] Scheduling Could you try again to scheduled for follow up with Dr. Melvyn Neth.  If you can't reach patient, reach out to his daughter April.  Thanks

## 2023-02-26 ENCOUNTER — Encounter: Payer: Self-pay | Admitting: Interventional Radiology

## 2023-03-03 ENCOUNTER — Encounter: Payer: Self-pay | Admitting: Oncology

## 2023-04-08 ENCOUNTER — Other Ambulatory Visit: Payer: Self-pay | Admitting: Cardiology

## 2023-04-10 ENCOUNTER — Encounter: Payer: Self-pay | Admitting: Oncology

## 2023-04-15 ENCOUNTER — Encounter: Payer: Self-pay | Admitting: Oncology

## 2023-11-04 DEATH — deceased
